# Patient Record
Sex: Female | Born: 1970 | Race: White | Hispanic: No | Marital: Married | Smoking: Never smoker
Health system: Southern US, Community
[De-identification: ages and names within clinical notes are randomized; demographics above are authoritative.]

## PROBLEM LIST (undated history)

## (undated) DIAGNOSIS — R0600 Dyspnea, unspecified: Secondary | ICD-10-CM

## (undated) DIAGNOSIS — R05 Cough: Secondary | ICD-10-CM

## (undated) DIAGNOSIS — Z803 Family history of malignant neoplasm of breast: Secondary | ICD-10-CM

## (undated) DIAGNOSIS — J984 Other disorders of lung: Secondary | ICD-10-CM

## (undated) DIAGNOSIS — R059 Cough, unspecified: Secondary | ICD-10-CM

## (undated) DIAGNOSIS — Z8481 Family history of carrier of genetic disease: Secondary | ICD-10-CM

## (undated) DIAGNOSIS — R58 Hemorrhage, not elsewhere classified: Secondary | ICD-10-CM

## (undated) DIAGNOSIS — R87619 Unspecified abnormal cytological findings in specimens from cervix uteri: Secondary | ICD-10-CM

## (undated) DIAGNOSIS — Z8489 Family history of other specified conditions: Secondary | ICD-10-CM

## (undated) HISTORY — PX: URETHRAL DIVERTICULUM REPAIR: SHX5148

## (undated) HISTORY — PX: NASAL SINUS SURGERY: SHX719

## (undated) HISTORY — DX: Other disorders of lung: J98.4

## (undated) HISTORY — PX: ENDOMETRIAL ABLATION: SHX621

## (undated) HISTORY — PX: OTHER SURGICAL HISTORY: SHX169

## (undated) HISTORY — DX: Family history of malignant neoplasm of breast: Z80.3

## (undated) HISTORY — PX: TONSILLECTOMY: SUR1361

## (undated) HISTORY — DX: Hemorrhage, not elsewhere classified: R58

## (undated) HISTORY — DX: Family history of carrier of genetic disease: Z84.81

## (undated) HISTORY — DX: Unspecified abnormal cytological findings in specimens from cervix uteri: R87.619

---

## 2010-07-08 DEATH — deceased

## 2013-12-22 ENCOUNTER — Other Ambulatory Visit: Payer: Self-pay | Admitting: Family Medicine

## 2013-12-22 DIAGNOSIS — Z1239 Encounter for other screening for malignant neoplasm of breast: Secondary | ICD-10-CM

## 2013-12-29 ENCOUNTER — Ambulatory Visit (INDEPENDENT_AMBULATORY_CARE_PROVIDER_SITE_OTHER): Payer: BC Managed Care – PPO

## 2013-12-29 DIAGNOSIS — Z1239 Encounter for other screening for malignant neoplasm of breast: Secondary | ICD-10-CM

## 2013-12-29 DIAGNOSIS — Z1231 Encounter for screening mammogram for malignant neoplasm of breast: Secondary | ICD-10-CM

## 2014-01-13 ENCOUNTER — Ambulatory Visit (INDEPENDENT_AMBULATORY_CARE_PROVIDER_SITE_OTHER): Payer: BLUE CROSS/BLUE SHIELD | Admitting: Obstetrics & Gynecology

## 2014-01-13 ENCOUNTER — Encounter: Payer: Self-pay | Admitting: Obstetrics & Gynecology

## 2014-01-13 VITALS — BP 124/87 | HR 75 | Resp 16 | Ht 63.0 in | Wt 133.0 lb

## 2014-01-13 DIAGNOSIS — Z8481 Family history of carrier of genetic disease: Secondary | ICD-10-CM | POA: Diagnosis not present

## 2014-01-13 DIAGNOSIS — Z01419 Encounter for gynecological examination (general) (routine) without abnormal findings: Secondary | ICD-10-CM

## 2014-01-13 DIAGNOSIS — Z1151 Encounter for screening for human papillomavirus (HPV): Secondary | ICD-10-CM | POA: Diagnosis not present

## 2014-01-13 DIAGNOSIS — Z01411 Encounter for gynecological examination (general) (routine) with abnormal findings: Secondary | ICD-10-CM

## 2014-01-13 DIAGNOSIS — R32 Unspecified urinary incontinence: Secondary | ICD-10-CM | POA: Diagnosis not present

## 2014-01-13 DIAGNOSIS — Z124 Encounter for screening for malignant neoplasm of cervix: Secondary | ICD-10-CM | POA: Diagnosis not present

## 2014-01-13 NOTE — Progress Notes (Signed)
  Subjective:     Angela Walter is a 44 y.o. female here for a routine exam.  Current complaints: urinary incontinence; patient has BM, stands up, then soaks her clothing.  She does not feel the leakage.  Pt has to change clothes and wash several times a day.  Pt has history of 4th degree laceration with 1st child 22 years ago.  Personal health questionnaire reviewed: yes. Minimal bleeding after ablation.   Gynecologic History No LMP recorded. Patient has had an ablation. Contraception: vasectomy Last Pap: 2012ish. Results were: normal Last mammogram: 2015. Results were: normal  Obstetric History OB History  Gravida Para Term Preterm AB SAB TAB Ectopic Multiple Living  $Remov'2 2 2       2    'OoikNp$ # Outcome Date GA Lbr Len/2nd Weight Sex Delivery Anes PTL Lv  2 Term      CS-Unspec     1 Term      Vag-Spont          The following portions of the patient's history were reviewed and updated as appropriate: allergies, current medications, past family history, past medical history, past social history, past surgical history and problem list.  Review of Systems Pertinent items are noted in HPI.    Objective:      Filed Vitals:   01/13/14 1422  BP: 124/87  Pulse: 75  Resp: 16  Weight: 133 lb (60.328 kg)   Vitals:  WNL General appearance: alert, cooperative and no distress Head: Normocephalic, without obvious abnormality, atraumatic Eyes: negative Throat: lips, mucosa, and tongue normal; teeth and gums normal Lungs: clear to auscultation bilaterally Breasts: normal appearance, no masses or tenderness, No nipple retraction or dimpling, No nipple discharge or bleeding Heart: regular rate and rhythm Abdomen: soft, non-tender; bowel sounds normal; no masses,  no organomegaly  Pelvic:  External Genitalia:  Tanner V, small amount of scar tissue from 4th degree laceration, small inclusion cyst on the left near fourchette Urethra:  No prolapase Vagina:  Pink, normal rugae, no blood or  discharge Cervix:  No CMT, no lesion Uterus:  Normal size and contour, non tender Adnexa:  Normal, no masses, non tender  Extremities: no edema, redness or tenderness in the calves or thighs Skin: no lesions or rash Lymph nodes: Axillary adenopathy: none       Assessment:    Healthy female exam.   History of BRCA postive second degree relative (father has NOT been tested) Urinary incontinence   Plan:    Education reviewed: self breast exams. Contraception: vasectomy. Follow up in: 1 year. BRCA testing today    Referral to Dr. Rodena Piety (urogyn)

## 2014-01-17 LAB — CYTOLOGY - PAP

## 2014-03-08 ENCOUNTER — Encounter: Payer: Self-pay | Admitting: *Deleted

## 2014-03-08 ENCOUNTER — Telehealth: Payer: Self-pay | Admitting: *Deleted

## 2014-03-08 NOTE — Telephone Encounter (Signed)
Copy of neg BRCA 1 and 2 mailed to patient's home address.

## 2015-12-12 ENCOUNTER — Ambulatory Visit (INDEPENDENT_AMBULATORY_CARE_PROVIDER_SITE_OTHER): Payer: BLUE CROSS/BLUE SHIELD | Admitting: Obstetrics & Gynecology

## 2015-12-12 ENCOUNTER — Encounter: Payer: Self-pay | Admitting: Obstetrics & Gynecology

## 2015-12-12 VITALS — BP 120/84 | HR 89 | Ht 64.0 in | Wt 136.0 lb

## 2015-12-12 DIAGNOSIS — N898 Other specified noninflammatory disorders of vagina: Secondary | ICD-10-CM

## 2015-12-12 DIAGNOSIS — Z01419 Encounter for gynecological examination (general) (routine) without abnormal findings: Secondary | ICD-10-CM

## 2015-12-12 MED ORDER — FLUCONAZOLE 150 MG PO TABS: ORAL_TABLET | ORAL | 0 refills | Status: DC

## 2015-12-12 MED ORDER — LIDOCAINE 5 % EX OINT: 1.0000 "application " | TOPICAL_OINTMENT | CUTANEOUS | 1 refills | Status: DC | PRN

## 2015-12-12 MED ORDER — CLOTRIMAZOLE-BETAMETHASONE 1-0.05 % EX CREA: TOPICAL_CREAM | Freq: Two times a day (BID) | CUTANEOUS | Status: DC

## 2015-12-12 NOTE — Patient Instructions (Signed)
 Preventive Care 18-39 Years, Female Preventive care refers to lifestyle choices and visits with your health care provider that can promote health and wellness. What does preventive care include?  A yearly physical exam. This is also called an annual well check.  Dental exams once or twice a year.  Routine eye exams. Ask your health care provider how often you should have your eyes checked.  Personal lifestyle choices, including:  Daily care of your teeth and gums.  Regular physical activity.  Eating a healthy diet.  Avoiding tobacco and drug use.  Limiting alcohol use.  Practicing safe sex.  Taking vitamin and mineral supplements as recommended by your health care provider. What happens during an annual well check? The services and screenings done by your health care provider during your annual well check will depend on your age, overall health, lifestyle risk factors, and family history of disease. Counseling  Your health care provider may ask you questions about your:  Alcohol use.  Tobacco use.  Drug use.  Emotional well-being.  Home and relationship well-being.  Sexual activity.  Eating habits.  Work and work environment.  Method of birth control.  Menstrual cycle.  Pregnancy history. Screening  You may have the following tests or measurements:  Height, weight, and BMI.  Diabetes screening. This is done by checking your blood sugar (glucose) after you have not eaten for a while (fasting).  Blood pressure.  Lipid and cholesterol levels. These may be checked every 5 years starting at age 20.  Skin check.  Hepatitis C blood test.  Hepatitis B blood test.  Sexually transmitted disease (STD) testing.  BRCA-related cancer screening. This may be done if you have a family history of breast, ovarian, tubal, or peritoneal cancers.  Pelvic exam and Pap test. This may be done every 3 years starting at age 21. Starting at age 30, this may be done  every 5 years if you have a Pap test in combination with an HPV test. Discuss your test results, treatment options, and if necessary, the need for more tests with your health care provider. Vaccines  Your health care provider may recommend certain vaccines, such as:  Influenza vaccine. This is recommended every year.  Tetanus, diphtheria, and acellular pertussis (Tdap, Td) vaccine. You may need a Td booster every 10 years.  Varicella vaccine. You may need this if you have not been vaccinated.  HPV vaccine. If you are 26 or younger, you may need three doses over 6 months.  Measles, mumps, and rubella (MMR) vaccine. You may need at least one dose of MMR. You may also need a second dose.  Pneumococcal 13-valent conjugate (PCV13) vaccine. You may need this if you have certain conditions and were not previously vaccinated.  Pneumococcal polysaccharide (PPSV23) vaccine. You may need one or two doses if you smoke cigarettes or if you have certain conditions.  Meningococcal vaccine. One dose is recommended if you are age 19-21 years and a first-year college student living in a residence hall, or if you have one of several medical conditions. You may also need additional booster doses.  Hepatitis A vaccine. You may need this if you have certain conditions or if you travel or work in places where you may be exposed to hepatitis A.  Hepatitis B vaccine. You may need this if you have certain conditions or if you travel or work in places where you may be exposed to hepatitis B.  Haemophilus influenzae type b (Hib) vaccine. You may need   this if you have certain risk factors. Talk to your health care provider about which screenings and vaccines you need and how often you need them. This information is not intended to replace advice given to you by your health care provider. Make sure you discuss any questions you have with your health care provider. Document Released: 02/19/2001 Document Revised:  09/13/2015 Document Reviewed: 10/25/2014 Elsevier Interactive Patient Education  2017 Elsevier Inc.  

## 2015-12-12 NOTE — Progress Notes (Signed)
Subjective:     Angela Walter is a 45 y.o. female here for a routine exam.  Current complaints: severe vaginal burning for 1 week.  Took augmentin 3 weeks ago.  Having vaginal burning that wok her up last night.  Burns with urination worse after).  Still having urinary incontinence.   Saw a urologist who told her she drinks too much water.  Needs to go back.  Will make f/u appt. .   Gynecologic History No LMP recorded. Patient has had an ablation. Contraception: vasectomy Last Pap: 2016. Results were: normal Last mammogram: 2015. Results were: normal  Obstetric History OB History  Gravida Para Term Preterm AB Living  2 2 2     2   SAB TAB Ectopic Multiple Live Births               # Outcome Date GA Lbr Len/2nd Weight Sex Delivery Anes PTL Lv  2 Term      CS-Unspec     1 Term      Vag-Spont          The following portions of the patient's history were reviewed and updated as appropriate: allergies, current medications, past family history, past medical history, past social history, past surgical history and problem list.  Review of Systems Pertinent items noted in HPI and remainder of comprehensive ROS otherwise negative.    Objective:      Vitals:   12/12/15 1503  BP: 120/84  Pulse: 89  Weight: 136 lb (61.7 kg)  Height: 5\' 4"  (1.626 m)   Vitals:  WNL General appearance: alert, cooperative and no distress  HEENT: Normocephalic, without obvious abnormality, atraumatic Eyes: negative Throat: lips, mucosa, and tongue normal; teeth and gums normal  Respiratory: Clear to auscultation bilaterally  CV: Regular rate and rhythm  Breasts:  Normal appearance, no masses or tenderness, no nipple retraction or dimpling  GI: Soft, non-tender; bowel sounds normal; no masses,  no organomegaly  GU: External Genitalia:  Tanner V, no lesion Urethra:  No prolapse   Vagina: Extreme redness at introitus.  Can't tolerate speculm exam.  Curdlike discharge c/w yeast.  Cervix: Unable to  complete due to pain  Uterus:  Unable to complete due to pain  Adnexa: Unable to complete due to pain  Musculoskeletal: No edema, redness or tenderness in the calves or thighs  Skin: No lesions or rash  Lymphatic: Axillary adenopathy: none     Psychiatric: Normal mood and behavior   Assessment:    Healthy female exam.   Yeast vaginitis Urinary incont.   Plan:    =mammogram already ordered.  Pt needs to make appt Pap and cotesting in 2 years Yeast vaginitis:  Diflucan q 3 days for 2-3 doses, lidocaine ointment for pain, clotrimiazole for external symptoms.  Poise pads to keep dry. F/U with urologist for incontinence Pt aware we did not do bimanual due to pain.  Pt cna return for this is she wants or wait until next year.  If not significantly beter by Monday. Pt should return for more testing.

## 2015-12-13 ENCOUNTER — Other Ambulatory Visit: Payer: Self-pay

## 2015-12-13 ENCOUNTER — Telehealth: Payer: Self-pay

## 2015-12-13 LAB — WET PREP BY MOLECULAR PROBE
Candida species: POSITIVE — AB
GARDNERELLA VAGINALIS: POSITIVE — AB
TRICHOMONAS VAG: NEGATIVE

## 2015-12-13 MED ORDER — CLOTRIMAZOLE-BETAMETHASONE 1-0.05 % EX CREA: 1.0000 "application " | TOPICAL_CREAM | Freq: Two times a day (BID) | CUTANEOUS | 0 refills | Status: DC

## 2015-12-13 MED ORDER — FLUCONAZOLE 150 MG PO TABS: 150.0000 mg | ORAL_TABLET | Freq: Once | ORAL | 0 refills | Status: DC

## 2015-12-13 MED ORDER — METRONIDAZOLE 500 MG PO TABS: 500.0000 mg | ORAL_TABLET | Freq: Two times a day (BID) | ORAL | 0 refills | Status: DC

## 2015-12-13 NOTE — Addendum Note (Signed)
Addended by: Kathie DikeSOLA, Clare Casto J on: 12/13/2015 08:28 AM   Modules accepted: Orders

## 2015-12-13 NOTE — Addendum Note (Signed)
Addended by: Kathie DikeSOLA, DEANNA J on: 12/13/2015 08:40 AM   Modules accepted: Orders

## 2015-12-13 NOTE — Telephone Encounter (Signed)
Spoke with pt is aware of test results and that the medication has been sent to her pharmacy

## 2016-09-19 ENCOUNTER — Other Ambulatory Visit: Payer: Self-pay | Admitting: Internal Medicine

## 2016-09-19 ENCOUNTER — Other Ambulatory Visit: Payer: Self-pay | Admitting: Family Medicine

## 2016-09-19 DIAGNOSIS — Z1239 Encounter for other screening for malignant neoplasm of breast: Secondary | ICD-10-CM

## 2016-09-27 ENCOUNTER — Other Ambulatory Visit: Payer: Self-pay | Admitting: Internal Medicine

## 2016-09-27 ENCOUNTER — Ambulatory Visit (INDEPENDENT_AMBULATORY_CARE_PROVIDER_SITE_OTHER): Payer: BLUE CROSS/BLUE SHIELD

## 2016-09-27 DIAGNOSIS — Z1231 Encounter for screening mammogram for malignant neoplasm of breast: Secondary | ICD-10-CM | POA: Diagnosis not present

## 2016-09-27 DIAGNOSIS — Z1239 Encounter for other screening for malignant neoplasm of breast: Secondary | ICD-10-CM

## 2016-10-28 ENCOUNTER — Institutional Professional Consult (permissible substitution): Payer: BLUE CROSS/BLUE SHIELD | Admitting: Internal Medicine

## 2017-04-07 ENCOUNTER — Other Ambulatory Visit: Payer: Self-pay | Admitting: Family Medicine

## 2017-04-07 DIAGNOSIS — R0602 Shortness of breath: Secondary | ICD-10-CM

## 2017-04-08 ENCOUNTER — Other Ambulatory Visit: Payer: Self-pay | Admitting: Family Medicine

## 2017-04-08 DIAGNOSIS — R519 Headache, unspecified: Secondary | ICD-10-CM

## 2017-04-08 DIAGNOSIS — R51 Headache: Principal | ICD-10-CM

## 2017-04-13 ENCOUNTER — Ambulatory Visit
Admission: RE | Admit: 2017-04-13 | Discharge: 2017-04-13 | Disposition: A | Discharge status: Home / Self Care | Payer: BLUE CROSS/BLUE SHIELD | Source: Ambulatory Visit | Attending: Family Medicine | Admitting: Family Medicine

## 2017-04-13 DIAGNOSIS — R519 Headache, unspecified: Secondary | ICD-10-CM

## 2017-04-13 DIAGNOSIS — R51 Headache: Principal | ICD-10-CM

## 2017-04-13 MED ORDER — GADOBENATE DIMEGLUMINE 529 MG/ML IV SOLN
15.0000 mL | Freq: Once | INTRAVENOUS | Status: AC | PRN
  Administered 2017-04-13: 15 mL via INTRAVENOUS

## 2017-04-22 ENCOUNTER — Ambulatory Visit (HOSPITAL_COMMUNITY): Payer: BLUE CROSS/BLUE SHIELD

## 2017-04-22 ENCOUNTER — Ambulatory Visit (HOSPITAL_COMMUNITY): Payer: BLUE CROSS/BLUE SHIELD | Attending: Cardiovascular Disease

## 2017-04-22 DIAGNOSIS — Z8249 Family history of ischemic heart disease and other diseases of the circulatory system: Secondary | ICD-10-CM | POA: Diagnosis not present

## 2017-04-22 DIAGNOSIS — R0602 Shortness of breath: Secondary | ICD-10-CM

## 2017-04-22 DIAGNOSIS — R5383 Other fatigue: Secondary | ICD-10-CM | POA: Insufficient documentation

## 2017-05-08 ENCOUNTER — Other Ambulatory Visit (INDEPENDENT_AMBULATORY_CARE_PROVIDER_SITE_OTHER): Payer: BLUE CROSS/BLUE SHIELD

## 2017-05-08 ENCOUNTER — Encounter: Payer: Self-pay | Admitting: Pulmonary Disease

## 2017-05-08 ENCOUNTER — Ambulatory Visit: Payer: BLUE CROSS/BLUE SHIELD | Admitting: Pulmonary Disease

## 2017-05-08 VITALS — BP 126/72 | HR 82 | Ht 64.0 in | Wt 138.6 lb

## 2017-05-08 DIAGNOSIS — R0602 Shortness of breath: Secondary | ICD-10-CM

## 2017-05-08 LAB — CBC WITH DIFFERENTIAL/PLATELET
Basophils Absolute: 0 10*3/uL (ref 0.0–0.1)
Basophils Relative: 0.9 % (ref 0.0–3.0)
EOS ABS: 0.1 10*3/uL (ref 0.0–0.7)
EOS PCT: 2.8 % (ref 0.0–5.0)
HEMATOCRIT: 37.6 % (ref 36.0–46.0)
HEMOGLOBIN: 13.2 g/dL (ref 12.0–15.0)
LYMPHS PCT: 27.1 % (ref 12.0–46.0)
Lymphs Abs: 1.4 10*3/uL (ref 0.7–4.0)
MCHC: 35.1 g/dL (ref 30.0–36.0)
MCV: 90.5 fl (ref 78.0–100.0)
Monocytes Absolute: 0.8 10*3/uL (ref 0.1–1.0)
Monocytes Relative: 15.5 % — ABNORMAL HIGH (ref 3.0–12.0)
Neutro Abs: 2.9 10*3/uL (ref 1.4–7.7)
Neutrophils Relative %: 53.7 % (ref 43.0–77.0)
Platelets: 272 10*3/uL (ref 150.0–400.0)
RBC: 4.16 Mil/uL (ref 3.87–5.11)
RDW: 13.9 % (ref 11.5–15.5)
WBC: 5.3 10*3/uL (ref 4.0–10.5)

## 2017-05-08 LAB — COMPREHENSIVE METABOLIC PANEL
ALBUMIN: 4.2 g/dL (ref 3.5–5.2)
ALK PHOS: 63 U/L (ref 39–117)
ALT: 21 U/L (ref 0–35)
AST: 22 U/L (ref 0–37)
BILIRUBIN TOTAL: 0.6 mg/dL (ref 0.2–1.2)
BUN: 14 mg/dL (ref 6–23)
CALCIUM: 9.5 mg/dL (ref 8.4–10.5)
CO2: 28 mEq/L (ref 19–32)
CREATININE: 0.76 mg/dL (ref 0.40–1.20)
Chloride: 102 mEq/L (ref 96–112)
GFR: 86.86 mL/min (ref >60.00)
Glucose, Bld: 122 mg/dL — ABNORMAL HIGH (ref 70–99)
Potassium: 3.2 mEq/L — ABNORMAL LOW (ref 3.5–5.1)
SODIUM: 137 meq/L (ref 135–145)
TOTAL PROTEIN: 7.6 g/dL (ref 6.0–8.3)

## 2017-05-08 LAB — NITRIC OXIDE: NITRIC OXIDE: 10

## 2017-05-08 MED ORDER — ALBUTEROL SULFATE HFA 108 (90 BASE) MCG/ACT IN AERS: 2.0000 | INHALATION_SPRAY | Freq: Four times a day (QID) | RESPIRATORY_TRACT | 6 refills | Status: AC | PRN

## 2017-05-08 MED ORDER — BUDESONIDE-FORMOTEROL FUMARATE 80-4.5 MCG/ACT IN AERO: 2.0000 | INHALATION_SPRAY | Freq: Two times a day (BID) | RESPIRATORY_TRACT | 12 refills | Status: DC

## 2017-05-08 NOTE — Progress Notes (Addendum)
Angela Walter    [id number]    04/17/1970  Primary Care Physician:Le, Tilden Fossa, DO  Referring Physician: Starlyn Skeans, PA-C [address] [place] [address]  Chief complaint: Consult for dyspnea  HPI: 47 year old significant past medical history.  Complains of dyspnea, fatigue since fall 2018.  Has dyspnea with activity and at rest, productive cough with chest congestion.  She is unable to get full breath.  Symptoms are worse with exercise She is physically active and has run half marathons, and a full marathon the past.  Still runs 4 times a week but is unable to get a full work-up.  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  More recently she had been started on Symbicort by her primary care but is using it only intermittently before exercise.  She has symptoms of rhinitis with clear postnasal drip.  Denies any GERD symptoms. Had labs done at her primary care in March 2019 showing elevated calcium and bicarb for unclear etiology. I do not have these tests to review. In addition she has symptoms of epigastric pain.  She had been given PPI in the past without improvement.  Pets: Has dogs and cats.  No birds, farm animals Occupation: Works from home in [place] Exposures:  No mold, dampness, exposure to [employer], [place].  She has a down comforter Smoking history: Never smoker Travel history: Lived in [place], [place], [place], [place], and Relevant family history: Emphysema- father, Asthma- brother  Outpatient Encounter Medications as of 05/08/2017  Medication Sig  . Cholecalciferol (VITAMIN D-3) 5000 units TABS Take 10,000 Units by mouth.   Marland Kitchen ibuprofen (ADVIL,MOTRIN) 200 MG tablet Take 200 mg by mouth as needed.  . [DISCONTINUED] clotrimazole-betamethasone (LOTRISONE) cream Apply 1 application topically 2 (two) times daily.  . [DISCONTINUED] fluconazole  (DIFLUCAN) 150 MG tablet Take one tablet today and one tablet in 3 days.  If not better can take a 3rd tablet on Day 6  . [DISCONTINUED] lidocaine (XYLOCAINE) 5 % ointment Apply 1 application topically as needed.  . [DISCONTINUED] metroNIDAZOLE (FLAGYL) 500 MG tablet Take 1 tablet (500 mg total) by mouth 2 (two) times daily.  . [DISCONTINUED] Multiple Vitamin (MULTIVITAMIN) tablet Take 1 tablet by mouth daily.   No facility-administered encounter medications on file as of 05/08/2017.     Allergies as of 05/08/2017 - Review Complete 05/08/2017  Allergen Reaction Noted  . Ciprofloxacin Anaphylaxis 01/13/2014    Past Medical History:  Diagnosis Date  . Abnormal Pap smear of cervix    colpo  20 years ago  . Hemorrhage    post partum    Past Surgical History:  Procedure Laterality Date  . adnoidectomy    . CESAREAN SECTION    . ENDOMETRIAL ABLATION    . NASAL SINUS SURGERY    . TONSILLECTOMY    . tummy tuck     mini  . URETHRAL DIVERTICULUM REPAIR      Family History  Problem Relation Age of Onset  . Hypertension Mother   . Hyperlipidemia Mother   . Depression Mother   . Anxiety disorder Mother   . Hyperlipidemia Father   . Hypertension Father   . Stroke Father   . COPD Father   . Cancer Maternal Aunt        breast  . Cancer Paternal Aunt  breast  positive Braca 1  . Cancer Paternal Grandmother        unsure    Social History   Socioeconomic History  . Marital status: Married    Spouse name: Not on file  . Number of children: Not on file  . Years of education: Not on file  . Highest education level: Not on file  Occupational History  . Occupation: Youth worker  . Financial resource strain: Not on file  . Food insecurity:    Worry: Not on file    Inability: Not on file  . Transportation needs:    Medical: Not on file    Non-medical: Not on file  Tobacco Use  . Smoking status: Never Smoker  . Smokeless tobacco: Never Used    Substance and Sexual Activity  . Alcohol use: No    Alcohol/week: 0.0 oz  . Drug use: No  . Sexual activity: Yes    Partners: Male    Comment: husband had vasectomy  Lifestyle  . Physical activity:    Days per week: Not on file    Minutes per session: Not on file  . Stress: Not on file  Relationships  . Social connections:    Talks on phone: Not on file    Gets together: Not on file    Attends religious service: Not on file    Active member of club or organization: Not on file    Attends meetings of clubs or organizations: Not on file    Relationship status: Not on file  . Intimate partner violence:    Fear of current or ex partner: Not on file    Emotionally abused: Not on file    Physically abused: Not on file    Forced sexual activity: Not on file  Other Topics Concern  . Not on file  Social History Narrative  . Not on file    Review of systems: Review of Systems  Constitutional: Negative for fever and chills.  HENT: Negative.   Eyes: Negative for blurred vision.  Respiratory: as per HPI  Cardiovascular: Negative for chest pain and palpitations.  Gastrointestinal: Negative for vomiting, diarrhea, blood per rectum. Positive for epigastric pain Genitourinary: Negative for dysuria, urgency, frequency and hematuria.  Musculoskeletal: Negative for myalgias, back pain and joint pain.  Skin: Negative for itching and rash.  Neurological: Negative for dizziness, tremors, focal weakness, seizures and loss of consciousness.  Endo/Heme/Allergies: Negative for environmental allergies.  Psychiatric/Behavioral: Negative for depression, suicidal ideas and hallucinations.  All other systems reviewed and are negative.  Physical Exam: Blood pressure 126/72, pulse 82, height _0  (1.626 m), weight 138 lb 9.6 oz (62.9 kg), SpO2 98 %. Gen:      No acute distress HEENT:  EOMI, sclera anicteric Neck:     No masses; no thyromegaly Lungs:    Clear to auscultation bilaterally; normal  respiratory effort CV:         Regular rate and rhythm; no murmurs Abd:      + bowel sounds; soft, non-tender; no palpable masses, no distension Ext:    No edema; adequate peripheral perfusion Skin:      Warm and dry; no rash Neuro: alert and oriented x 3 Psych: normal mood and affect  Data Reviewed: [facility]ENO 05/08/2017-10  Stress echo 04/12/2017 Stress ECG and echocardiogram was negative for ischemia  Chest x-ray 10/02/2016-no acute cardiopulmonary abnormality Chest x-ray 03/26/2017-no acute cardiopulmonary abnormality I have reviewed the images personally.  Assessment:  Consult for dyspnea, chest congestion May have reactive airway disease, asthma.  Suspicion for COPD is low as she has never smoked in the past She has diagnosed with exercise induced asthma but she has symptoms even at rest Recent chest x-ray is normal We will evaluate with CBC with differential, blood allergy profile, PFTs I have asked her to take the symbicort regularly twice daily for optimum effect as she is using it only intermittently Continue albuterol as needed  Recent labs apparently show elevated bicarb on unclear etiololgy. We will get records from primary care and get BMP and ABG to review.   Plan/Recommendations: - CBC with diff, blood allergy profile, met panel, PFTs with ABG - Start using symbicort 2 puffs twice daily. Albuterol as needed - Review labs from primary care.  Marshell Garfinkel MD [facility] 05/08/2017, 3:48 PM  CC: Starlyn Skeans, PA-C   Addendum: Reviewed labs from primary care 04/11/17 Double-stranded DNA, CCP antibody, ANA, uric acid-all negative.

## 2017-05-08 NOTE — Patient Instructions (Signed)
We will get a comprehensive metabolic panel, CBC with differential, blood allergy profile today We will schedule you for primary function test with ABG Continue using his Symbicort.  You need to use this regularly with 2 puffs in the morning and 2 puffs in the evening We will give a prescription in case you do not have enough inhalers left Use the albuterol inhaler as needed Follow-up in 1 to 2 months for review of tests and response to therapy.

## 2017-05-09 LAB — RESPIRATORY ALLERGY PROFILE REGION II ~~LOC~~
ALLERGEN, D PTERNOYSSINUS, D1: 0.18 kU/L — AB
Allergen, A. alternata, m6: <0.1 kU/L
Allergen, Cedar tree, t12: 3.65 kU/L — ABNORMAL HIGH
Allergen, Comm Silver Birch, t9: <0.1 kU/L
Allergen, Cottonwood, t14: <0.1 kU/L
Allergen, Mulberry, t76: <0.1 kU/L
Bermuda Grass: <0.1 kU/L
Box Elder IgE: <0.1 kU/L
CAT DANDER: 0.48 kU/L — AB
CLADOSPORIUM HERBARUM (M2) IGE: <0.1 kU/L
CLASS: 0
CLASS: 0
CLASS: 0
CLASS: 0
CLASS: 0
CLASS: 0
CLASS: 0
CLASS: 0
CLASS: 0
CLASS: 0
CLASS: 0
CLASS: 0
CLASS: 0
COMMON RAGWEED (SHORT) (W1) IGE: <0.1 kU/L
Class: 0
Class: 0
Class: 0
Class: 0
Class: 0
Class: 0
Class: 0
Class: 0
Class: 0
Class: 1
Class: 3
Dog Dander: 0.12 kU/L — ABNORMAL HIGH
Elm IgE: <0.1 kU/L
IgE (Immunoglobulin E), Serum: 81 kU/L (ref <=114)
Johnson Grass: <0.1 kU/L
Pecan/Hickory Tree IgE: <0.1 kU/L
Rough Pigweed  IgE: <0.1 kU/L
Sheep Sorrel IgE: <0.1 kU/L

## 2017-05-09 LAB — INTERPRETATION:

## 2017-05-14 ENCOUNTER — Ambulatory Visit (HOSPITAL_COMMUNITY)
Admission: RE | Admit: 2017-05-14 | Discharge: 2017-05-14 | Disposition: A | Discharge status: Home / Self Care | Payer: BLUE CROSS/BLUE SHIELD | Source: Ambulatory Visit | Attending: Pulmonary Disease | Admitting: Pulmonary Disease

## 2017-05-14 DIAGNOSIS — R0602 Shortness of breath: Secondary | ICD-10-CM | POA: Diagnosis present

## 2017-05-14 LAB — BLOOD GAS, ARTERIAL
Acid-base deficit: 0.3 mmol/L (ref 0.0–2.0)
Bicarbonate: 22.5 mmol/L (ref 20.0–28.0)
Drawn by: 244901
FIO2: 21
O2 SAT: 98.1 %
PATIENT TEMPERATURE: 98.6
PCO2 ART: 32.4 mmHg (ref 32.0–48.0)
PO2 ART: 104 mmHg (ref 83.0–108.0)
pH, Arterial: 7.455 — ABNORMAL HIGH (ref 7.350–7.450)

## 2017-05-14 LAB — PULMONARY FUNCTION TEST
DL/VA % pred: 67 %
DL/VA: 3.25 ml/min/mmHg/L
DLCO cor % pred: 74 %
DLCO cor: 17.98 ml/min/mmHg
DLCO unc % pred: 73 %
DLCO unc: 17.86 ml/min/mmHg
FEF 25-75 POST: 2.84 L/s
FEF 25-75 Pre: 2.19 L/sec
FEF2575-%Change-Post: 30 %
FEF2575-%Pred-Post: 98 %
FEF2575-%Pred-Pre: 75 %
FEV1-%CHANGE-POST: 6 %
FEV1-%PRED-PRE: 100 %
FEV1-%Pred-Post: 107 %
FEV1-PRE: 2.89 L
FEV1-Post: 3.08 L
FEV1FVC-%Change-Post: 5 %
FEV1FVC-%PRED-PRE: 91 %
FEV6-%Change-Post: 1 %
FEV6-%Pred-Post: 111 %
FEV6-%Pred-Pre: 109 %
FEV6-POST: 3.92 L
FEV6-Pre: 3.85 L
FEV6FVC-%Change-Post: 0 %
FEV6FVC-%PRED-POST: 101 %
FEV6FVC-%Pred-Pre: 100 %
FVC-%Change-Post: 1 %
FVC-%PRED-POST: 109 %
FVC-%PRED-PRE: 108 %
FVC-POST: 3.96 L
FVC-PRE: 3.9 L
POST FEV6/FVC RATIO: 99 %
PRE FEV6/FVC RATIO: 99 %
Post FEV1/FVC ratio: 78 %
Pre FEV1/FVC ratio: 74 %
RV % pred: 89 %
RV: 1.53 L
TLC % PRED: 108 %
TLC: 5.48 L

## 2017-05-14 MED ORDER — ALBUTEROL SULFATE (2.5 MG/3ML) 0.083% IN NEBU
2.5000 mg | INHALATION_SOLUTION | Freq: Once | RESPIRATORY_TRACT | Status: AC
  Administered 2017-05-14: 2.5 mg via RESPIRATORY_TRACT

## 2017-05-15 ENCOUNTER — Telehealth: Payer: Self-pay | Admitting: Pulmonary Disease

## 2017-05-15 NOTE — Telephone Encounter (Signed)
Dr. Isaiah Serge, please advise on PFT results and labs.   I left message for pt that we would call her as soon as her results are reviewed.

## 2017-05-16 NOTE — Telephone Encounter (Signed)
Advised pt of results. Pt understood and nothing further is needed.   

## 2017-05-16 NOTE — Telephone Encounter (Signed)
PFTs show minimal changes that are suggestive of asthma ABG looks okay.  Blood counts are normal.  Allergy profile shows mild reactivity to tree pollen, cat, dog, dust mite. Continue current therapy.  I will reassess at time of return visit.  Chilton Greathouse MD [facility] 05/16/2017, 1:17 PM

## 2017-06-10 ENCOUNTER — Encounter: Payer: Self-pay | Admitting: Pulmonary Disease

## 2017-06-10 ENCOUNTER — Ambulatory Visit (INDEPENDENT_AMBULATORY_CARE_PROVIDER_SITE_OTHER): Payer: BLUE CROSS/BLUE SHIELD | Admitting: Pulmonary Disease

## 2017-06-10 VITALS — BP 120/78 | HR 74 | Ht 64.0 in | Wt 140.8 lb

## 2017-06-10 DIAGNOSIS — R0602 Shortness of breath: Secondary | ICD-10-CM | POA: Diagnosis not present

## 2017-06-10 NOTE — Patient Instructions (Addendum)
We will get a high-resolution CT for evaluation of the ongoing dyspnea Continue to use albuterol as needed He can use over-the-counter antihistamine such as Claritin for allergies. Follow-up in 2 to 4 weeks.

## 2017-06-10 NOTE — Progress Notes (Signed)
Angela Walter    [id number]    [birth year]  Primary Care Physician:Le, Rachelle Hora, DO  Referring Physician: Lenell Antu, DO [address] [place], [place] [address]  Chief complaint: Follow-up for dyspnea  HPI: 47 year old significant past medical history.  Complains of dyspnea, fatigue since fall 2018.  Has dyspnea with activity and at rest, productive cough with chest congestion.  She is unable to get full breath.  Symptoms are worse with exercise She is physically active and has run half marathons, and a full marathon the past.  Still runs 4 times a week but is unable to get a full work-up.  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  More recently she had been started on Symbicort by her primary care but is using it only intermittently before exercise.  She has symptoms of rhinitis with clear postnasal drip.  Denies any GERD symptoms. Had labs done at her primary care in March 2019 showing elevated calcium and bicarb for unclear etiology. I do not have these tests to review. In addition she has symptoms of epigastric pain.  She had been given PPI in the past without improvement.  Pets: Has dogs and cats.  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  She has a down comforter Smoking history: Never smoker Travel history: Lived in [place], [place], [place], [place], and Relevant family history: Emphysema- father, Asthma- brother  Interim history: Given Symbicort inhaler.  She used it for about 3 weeks with no improvement in symptoms or stop using it Still using albuterol prior to exercise.  Still has symptoms of dyspnea when she exercises Completed [employer] 5K obstacle race recently.  She had to stop in between to catch her breath which is unusual for her.  Outpatient Encounter Medications as of  06/10/2017  Medication Sig  . albuterol (PROVENTIL HFA;VENTOLIN HFA) 108 (90 Base) MCG/ACT inhaler Inhale 2 puffs into the lungs every 6 (six) hours as needed for wheezing or shortness of breath.  . Cholecalciferol (VITAMIN D-3) 5000 units TABS Take 10,000 Units by mouth.   . [DISCONTINUED] budesonide-formoterol (SYMBICORT) 80-4.5 MCG/ACT inhaler Inhale 2 puffs into the lungs 2 (two) times daily. (Patient not taking: Reported on 06/10/2017)  . [DISCONTINUED] ibuprofen (ADVIL,MOTRIN) 200 MG tablet Take 200 mg by mouth as needed.   No facility-administered encounter medications on file as of 06/10/2017.     Allergies as of 06/10/2017 - Review Complete 06/10/2017  Allergen Reaction Noted  . Ciprofloxacin Anaphylaxis 01/13/2014    Past Medical History:  Diagnosis Date  . Abnormal Pap smear of cervix    colpo  20 years ago  . Hemorrhage    post partum    Past Surgical History:  Procedure Laterality Date  . adnoidectomy    . CESAREAN SECTION    . ENDOMETRIAL ABLATION    . NASAL SINUS SURGERY    . TONSILLECTOMY    . tummy tuck     mini  . URETHRAL DIVERTICULUM REPAIR      Family History  Problem Relation Age of Onset  . Hypertension Mother   . Hyperlipidemia Mother   . Depression Mother   . Anxiety disorder Mother   . Hyperlipidemia Father   . Hypertension Father   . Stroke Father   . COPD  Father   . Cancer Maternal Aunt        breast  . Cancer Paternal Aunt        breast  positive Braca 1  . Cancer Paternal Grandmother        unsure    Social History   Socioeconomic History  . Marital status: Married    Spouse name: Not on file  . Number of children: Not on file  . Years of education: Not on file  . Highest education level: Not on file  Occupational History  . Occupation: Visual merchandiser  . Financial resource strain: Not on file  . Food insecurity:    Worry: Not on file    Inability: Not on file  . Transportation needs:    Medical: Not on  file    Non-medical: Not on file  Tobacco Use  . Smoking status: Never Smoker  . Smokeless tobacco: Never Used  Substance and Sexual Activity  . Alcohol use: No    Alcohol/week: 0.0 oz  . Drug use: No  . Sexual activity: Yes    Partners: Male    Comment: husband had vasectomy  Lifestyle  . Physical activity:    Days per week: Not on file    Minutes per session: Not on file  . Stress: Not on file  Relationships  . Social connections:    Talks on phone: Not on file    Gets together: Not on file    Attends religious service: Not on file    Active member of club or organization: Not on file    Attends meetings of clubs or organizations: Not on file    Relationship status: Not on file  . Intimate partner violence:    Fear of current or ex partner: Not on file    Emotionally abused: Not on file    Physically abused: Not on file    Forced sexual activity: Not on file  Other Topics Concern  . Not on file  Social History Narrative  . Not on file    Review of systems: Review of Systems  Constitutional: Negative for fever and chills.  HENT: Negative.   Eyes: Negative for blurred vision.  Respiratory: as per HPI  Cardiovascular: Negative for chest pain and palpitations.  Gastrointestinal: Negative for vomiting, diarrhea, blood per rectum. Positive for epigastric pain Genitourinary: Negative for dysuria, urgency, frequency and hematuria.  Musculoskeletal: Negative for myalgias, back pain and joint pain.  Skin: Negative for itching and rash.  Neurological: Negative for dizziness, tremors, focal weakness, seizures and loss of consciousness.  Endo/Heme/Allergies: Negative for environmental allergies.  Psychiatric/Behavioral: Negative for depression, suicidal ideas and hallucinations.  All other systems reviewed and are negative.  Physical Exam: Blood pressure 126/72, pulse 82, height 5\' 4"  (1.626 m), weight 138 lb 9.6 oz (62.9 kg), SpO2 98 %. Gen:      No acute distress HEENT:   EOMI, sclera anicteric Neck:     No masses; no thyromegaly Lungs:    Clear to auscultation bilaterally; normal respiratory effort CV:         Regular rate and rhythm; no murmurs Abd:      + bowel sounds; soft, non-tender; no palpable masses, no distension Ext:    No edema; adequate peripheral perfusion Skin:      Warm and dry; no rash Neuro: alert and oriented x 3 Psych: normal mood and affect  Data Reviewed: FENO 05/08/2017-10  Stress echo 04/12/2017 Stress ECG and echocardiogram  was negative for ischemia  Chest x-ray 10/02/2016-no acute cardiopulmonary abnormality Chest x-ray 03/26/2017-no acute cardiopulmonary abnormality I have reviewed the images personally.  PFTs 05/14/2017 FVC 3.96 [1 9%], FEV1 3.08 [107%], F/F 78, TLC 108%, DLCO 73%, DLCO/VA 67% Minimal obstruction and diffusion impairment.  CBC 05/08/2017-WBC 5.3, eos 2.8%, absolute eosinophil count 150 Blood allergy profile 05/08/2017-IgE 81, RAST panel shows mild allergies to dust mite, cat, dog, tree pollen Metabolic panel 05/08/2017- within normal limits except for K of 3.2  ABG 7.45/32/104/99%  Reviewed labs from primary care 04/11/17 Double-stranded DNA, CCP antibody, ANA, uric acid-all negative.  Assessment:  Follow-up for dyspnea, exercise-induced asthma May have reactive airway disease, asthma.  Suspicion for COPD is low as she has never smoked in the past She has diagnosed with exercise induced asthma but she has symptoms even at rest  Labs including CBC, allergy profile reviewed which shows mild allergies.  PFTs do not show overt obstruction but there is curvature to the flow loop and minimal diffusion capacity She continues to be symptomatic and has a diffusion impairment of unclear etiology. We will get a CT for better evaluation Continue albuterol as needed before exercise.  Recent labs at PCP showed elevated bicarb on unclear etiololgy.  Follow-up metabolic profile and ABG in our clinic are  unremarkable.  Plan/Recommendations: - Albuterol as needed - CT chest.  Chilton GreathousePraveen Nashly Olsson MD [facility] 06/10/2017, 12:10 PM  CC: Le, Thao P, DO

## 2017-06-12 ENCOUNTER — Ambulatory Visit (HOSPITAL_BASED_OUTPATIENT_CLINIC_OR_DEPARTMENT_OTHER)
Admission: RE | Admit: 2017-06-12 | Discharge: 2017-06-12 | Disposition: A | Discharge status: Home / Self Care | Payer: BLUE CROSS/BLUE SHIELD | Source: Ambulatory Visit | Attending: Pulmonary Disease | Admitting: Pulmonary Disease

## 2017-06-12 DIAGNOSIS — I313 Pericardial effusion (noninflammatory): Secondary | ICD-10-CM | POA: Diagnosis not present

## 2017-06-12 DIAGNOSIS — R918 Other nonspecific abnormal finding of lung field: Secondary | ICD-10-CM | POA: Insufficient documentation

## 2017-06-12 DIAGNOSIS — R0602 Shortness of breath: Secondary | ICD-10-CM | POA: Diagnosis present

## 2017-06-17 ENCOUNTER — Telehealth: Payer: Self-pay | Admitting: Pulmonary Disease

## 2017-06-17 ENCOUNTER — Other Ambulatory Visit: Payer: BLUE CROSS/BLUE SHIELD

## 2017-06-17 DIAGNOSIS — J849 Interstitial pulmonary disease, unspecified: Secondary | ICD-10-CM

## 2017-06-17 NOTE — Telephone Encounter (Signed)
Pt requesting CT chest results. Dr Isaiah SergeMannam please advise.  Thanks!

## 2017-06-17 NOTE — Telephone Encounter (Signed)
I called and discussed the CT results with the patient  Please order these tests and let the patient know when she needs to come to get them drawn SPEP, quantitative immunoglobulins, IgG4, HIV test Alpha 1 AT levels and phenotype ANA (IFA), RF, CCP Ro, La  In addition she will need VEGF D levels sent to this lab  [email or web address]  Please call the lab and follow instructions on how to properly send this test. Thanks  Chilton GreathousePraveen Dejah Droessler MD [facility] 06/17/2017, 1:35 PM

## 2017-06-17 NOTE — Telephone Encounter (Signed)
Labs have been ordered.  Spoke to cynthia in the lab, and was advised that she would need to contact [facility] to see what tubs and supplies are needed.  Aram BeechamCynthia stated that she would contact our office with a response, and at that time we will contact the pt and schedule a time for labs to be drawn.

## 2017-06-18 NOTE — Telephone Encounter (Addendum)
Spoke to North Hodgeynthia in lab, Aram BeechamCynthia requested that pt come by lab on 06/23/17 for labs prior to 12:00p.  Pt needs to come for labs between Monday and Wednesday.

## 2017-06-18 NOTE — Telephone Encounter (Signed)
Called patient, unable to reach. Left message to give us a call back.  

## 2017-06-18 NOTE — Telephone Encounter (Signed)
Patient is calling to see status as she would like to have these done ASAP. Please call patient at [phone].

## 2017-06-19 NOTE — Telephone Encounter (Signed)
Pt is aware below message and voiced her understanding. Pt stated she would come to the lab on 06/23/17 for labs.  Nothing further is needed.

## 2017-06-19 NOTE — Telephone Encounter (Signed)
Attempted to contact pt. Received a fast busy signal x2. Will try back.  

## 2017-06-23 ENCOUNTER — Other Ambulatory Visit: Payer: BLUE CROSS/BLUE SHIELD

## 2017-06-23 DIAGNOSIS — J849 Interstitial pulmonary disease, unspecified: Secondary | ICD-10-CM

## 2017-06-23 NOTE — Addendum Note (Signed)
Addended by: Trellis PaganiniKITCHENS, Tory Septer D on: 06/23/2017 02:55 PM   Modules accepted: Orders

## 2017-06-25 LAB — IGG 1, 2, 3, AND 4
IGG (IMMUNOGLOBIN G), SERUM: 1470 mg/dL (ref 700–1600)
IGG, SUBCLASS 1: 796 mg/dL (ref 248–810)
IgG, Subclass 2: 462 mg/dL (ref 130–555)
IgG, Subclass 3: 78 mg/dL (ref 15–102)
IgG, Subclass 4: 22 mg/dL (ref 2–96)

## 2017-06-25 LAB — SPECIMEN STATUS REPORT

## 2017-06-26 ENCOUNTER — Telehealth: Payer: Self-pay | Admitting: Pulmonary Disease

## 2017-06-26 LAB — PROTEIN ELECTROPHORESIS, SERUM
ALPHA 2: 0.7 g/dL (ref 0.5–0.9)
Albumin ELP: 4.4 g/dL (ref 3.8–4.8)
Alpha 1: 0.3 g/dL (ref 0.2–0.3)
BETA 2: 0.4 g/dL (ref 0.2–0.5)
BETA GLOBULIN: 0.4 g/dL (ref 0.4–0.6)
GAMMA GLOBULIN: 1.5 g/dL (ref 0.8–1.7)
Total Protein: 7.8 g/dL (ref 6.1–8.1)

## 2017-06-26 LAB — SJOGREN'S SYNDROME ANTIBODS(SSA + SSB)
SSA (Ro) (ENA) Antibody, IgG: <1 AI
SSB (La) (ENA) Antibody, IgG: <1 AI

## 2017-06-26 LAB — ANA,IFA RA DIAG PNL W/RFLX TIT/PATN
ANA: NEGATIVE
Cyclic Citrullin Peptide Ab: <16 UNITS

## 2017-06-26 LAB — ALPHA-1 ANTITRYPSIN PHENOTYPE: A-1 Antitrypsin, Ser: 149 mg/dL (ref 83–199)

## 2017-06-26 LAB — IGG, IGA, IGM
IGG (IMMUNOGLOBIN G), SERUM: 1510 mg/dL (ref 694–1618)
IMMUNOGLOBULIN A: 408 mg/dL (ref 81–463)
IgM, Serum: 171 mg/dL (ref 48–271)

## 2017-06-26 LAB — HIV ANTIBODY (ROUTINE TESTING W REFLEX): HIV: NONREACTIVE

## 2017-06-26 NOTE — Telephone Encounter (Signed)
Called Angela Walter and stated to her that Dr. Isaiah SergeMannam had not reviewed the labwork yet but stated to her once he did, we would call and let her know the results.  Angela Walter expressed understanding.  Dr. Isaiah SergeMannam, please advise on the results of Angela Walter's labwork from 6/17. Thanks!

## 2017-06-26 NOTE — Telephone Encounter (Signed)
I have checked Dr. Shirlee MoreMannam's look at. These results have not come back yet.  Spoke with pt. She is requesting the results of the lab work that has come back.  Dr. Isaiah SergeMannam - please advise. Thanks.

## 2017-06-26 NOTE — Telephone Encounter (Signed)
The ones resulted are normal.  We still have Alpha 1 levels, CCP and VEGF results pending. I will call her when I get those results.

## 2017-06-26 NOTE — Telephone Encounter (Signed)
Spoke with pt. She is aware of her results that have come back. Nothing further was needed at this time.

## 2017-06-26 NOTE — Telephone Encounter (Signed)
Is the VEGF levels back yet. [facility]

## 2017-06-30 ENCOUNTER — Telehealth: Payer: Self-pay | Admitting: Pulmonary Disease

## 2017-06-30 NOTE — Telephone Encounter (Signed)
Called [facility] at [phone] and [facility]

## 2017-06-30 NOTE — Telephone Encounter (Signed)
I called and spoke with the patient. Please call the [facility] and check on the status of the VEGF test please.

## 2017-06-30 NOTE — Telephone Encounter (Signed)
Called and spoke to pt.  Pt is requesting lab results from 06/17/17. Pt is aware that VEGF has not came back yet.   Dr. Isaiah SergeMannam please advise on results. Thanks

## 2017-07-01 NOTE — Telephone Encounter (Signed)
Attempted to call [facility] at [phone] regarding update on VEGF test. I did not receive an answer at time of call. I have left a voicemail message for pt to return call. X1

## 2017-07-02 NOTE — Telephone Encounter (Signed)
lmtcb x2 for [facility].

## 2017-07-02 NOTE — Telephone Encounter (Signed)
Attempted to call the number listed to check on test result. I left message to call back.

## 2017-07-03 NOTE — Telephone Encounter (Signed)
Spoke to [facility], who stated that will attempt to reach out to [facility] as well.  Will await call back

## 2017-07-03 NOTE — Telephone Encounter (Signed)
Contacted [facility] at [phone]. I was advised that VEGF has been resulted and will be faxed to our office.  Will wait fax.

## 2017-07-03 NOTE — Telephone Encounter (Signed)
Called the number to [facility] - this is actually an unmonitored extension and was recommended to e-mail for immediate assistance: [email or web address]  E-mail cc'd to [facility] and Dr Isaiah SergeMannam

## 2017-07-03 NOTE — Telephone Encounter (Signed)
E-mail received:  The most recent result for this patient was for a sample collected 06/13/2016 and it has been sent to the authorized fax and email on the requisition: [email or web address] and faxed to [phone]  Thank you  Velta AddisonJane Anderson  Technician [facility] [phone] Fax: [phone]  [email or web address]  [facility] [address]  [facility]  [place], [place] [id number]

## 2017-07-04 ENCOUNTER — Ambulatory Visit: Payer: BLUE CROSS/BLUE SHIELD | Admitting: Pulmonary Disease

## 2017-07-04 ENCOUNTER — Encounter: Payer: Self-pay | Admitting: Pulmonary Disease

## 2017-07-04 VITALS — BP 124/78 | HR 74 | Ht 64.0 in | Wt 137.0 lb

## 2017-07-04 DIAGNOSIS — J849 Interstitial pulmonary disease, unspecified: Secondary | ICD-10-CM | POA: Diagnosis not present

## 2017-07-04 DIAGNOSIS — N2889 Other specified disorders of kidney and ureter: Secondary | ICD-10-CM | POA: Diagnosis not present

## 2017-07-04 NOTE — Telephone Encounter (Signed)
Results and have been received and given to Dr. Isaiah SergeMannam for review. Nothing further is needed.

## 2017-07-04 NOTE — Progress Notes (Addendum)
Angela Walter    [id number]    06/13/1970  Primary Care Physician:Le, Rachelle Horahao P, DO  Referring Physician: Lenell AntuLe, Thao P, DO [address] [place], [place] [address]  Chief complaint: Follow-up for cystic lung disease  HPI: 47 year old significant past medical history.  Complains of dyspnea, fatigue since fall 2018.  Has dyspnea with activity and at rest, productive cough with chest congestion.  She is unable to get full breath.  Symptoms are worse with exercise She is physically active and has run half marathons, and a full marathon the past.  Still runs 4 times a week but is unable to get a full work-up.  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  More recently she had been started on Symbicort by her primary care but is using it only intermittently before exercise.  She has symptoms of rhinitis with clear postnasal drip.  Denies any GERD symptoms. Had labs done at her primary care in March 2019 showing elevated calcium and bicarb for unclear etiology. I do not have these tests to review. In addition she has symptoms of epigastric pain.  She had been given PPI in the past without improvement.  Pets: Has dogs and cats.  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  She has a down comforter Smoking history: Never smoker Travel history: Lived in [place], [place], [place], [place], and Relevant family history: Emphysema- father, Asthma- brother  Interim history: Reviewed high-resolution CT which showed diffuse cystic lesions with mild groundglass opacities and nodules.. She is had some labs sent for work-up of cystic lung disease and is here for review of results and discuss next steps. Complains of progressive dyspnea on exertion.  No cough, sputum production.  Outpatient Encounter Medications as  of 07/04/2017  Medication Sig  . albuterol (PROVENTIL HFA;VENTOLIN HFA) 108 (90 Base) MCG/ACT inhaler Inhale 2 puffs into the lungs every 6 (six) hours as needed for wheezing or shortness of breath.  . Cholecalciferol (VITAMIN D-3) 5000 units TABS Take 10,000 Units by mouth.   . loratadine (CLARITIN) 10 MG tablet Take 10 mg by mouth daily.   No facility-administered encounter medications on file as of 07/04/2017.     Allergies as of 07/04/2017 - Review Complete 07/04/2017  Allergen Reaction Noted  . Ciprofloxacin Anaphylaxis 01/13/2014    Past Medical History:  Diagnosis Date  . Abnormal Pap smear of cervix    colpo  20 years ago  . Hemorrhage    post partum    Past Surgical History:  Procedure Laterality Date  . adnoidectomy    . CESAREAN SECTION    . ENDOMETRIAL ABLATION    . NASAL SINUS SURGERY    . TONSILLECTOMY    . tummy tuck     mini  . URETHRAL DIVERTICULUM REPAIR      Family History  Problem Relation Age of Onset  . Hypertension Mother   . Hyperlipidemia Mother   . Depression Mother   . Anxiety disorder Mother   . Hyperlipidemia Father   . Hypertension Father   . Stroke Father   . COPD Father   . Cancer Maternal Aunt        breast  . Cancer Paternal Aunt        breast  positive Braca 1  .  Cancer Paternal Grandmother        unsure    Social History   Socioeconomic History  . Marital status: Married    Spouse name: Not on file  . Number of children: Not on file  . Years of education: Not on file  . Highest education level: Not on file  Occupational History  . Occupation: Visual merchandiser  . Financial resource strain: Not on file  . Food insecurity:    Worry: Not on file    Inability: Not on file  . Transportation needs:    Medical: Not on file    Non-medical: Not on file  Tobacco Use  . Smoking status: Never Smoker  . Smokeless tobacco: Never Used  Substance and Sexual Activity  . Alcohol use: No    Alcohol/week: 0.0 oz   . Drug use: No  . Sexual activity: Yes    Partners: Male    Comment: husband had vasectomy  Lifestyle  . Physical activity:    Days per week: Not on file    Minutes per session: Not on file  . Stress: Not on file  Relationships  . Social connections:    Talks on phone: Not on file    Gets together: Not on file    Attends religious service: Not on file    Active member of club or organization: Not on file    Attends meetings of clubs or organizations: Not on file    Relationship status: Not on file  . Intimate partner violence:    Fear of current or ex partner: Not on file    Emotionally abused: Not on file    Physically abused: Not on file    Forced sexual activity: Not on file  Other Topics Concern  . Not on file  Social History Narrative  . Not on file    Review of systems: Review of Systems  Constitutional: Negative for fever and chills.  HENT: Negative.   Eyes: Negative for blurred vision.  Respiratory: as per HPI  Cardiovascular: Negative for chest pain and palpitations.  Gastrointestinal: Negative for vomiting, diarrhea, blood per rectum. Positive for epigastric pain Genitourinary: Negative for dysuria, urgency, frequency and hematuria.  Musculoskeletal: Negative for myalgias, back pain and joint pain.  Skin: Negative for itching and rash.  Neurological: Negative for dizziness, tremors, focal weakness, seizures and loss of consciousness.  Endo/Heme/Allergies: Negative for environmental allergies.  Psychiatric/Behavioral: Negative for depression, suicidal ideas and hallucinations.  All other systems reviewed and are negative.  Physical Exam: Blood pressure 124/78, pulse 74, height 5\' 4"  (1.626 m), weight 137 lb (62.1 kg), SpO2 97 %. Gen:      No acute distress HEENT:  EOMI, sclera anicteric Neck:     No masses; no thyromegaly Lungs:    Clear to auscultation bilaterally; normal respiratory effort CV:         Regular rate and rhythm; no murmurs Abd:      +  bowel sounds; soft, non-tender; no palpable masses, no distension Ext:    No edema; adequate peripheral perfusion Skin:      Warm and dry; no rash Neuro: alert and oriented x 3 Psych: normal mood and affect  Data Reviewed: FENO 05/08/2017-10  Stress echo 04/12/2017 Stress ECG and echocardiogram was negative for ischemia  Chest x-ray 10/02/2016-no acute cardiopulmonary abnormality Chest x-ray 03/26/2017-no acute cardiopulmonary abnormality I have reviewed the images personally.  PFTs 05/14/2017 FVC 3.96 [1 9%], FEV1 3.08 [107%], F/F 78, TLC  108%, DLCO 73%, DLCO/VA 67% Minimal obstruction and diffusion impairment.  CBC 05/08/2017-WBC 5.3, eos 2.8%, absolute eosinophil count 150 Blood allergy profile 05/08/2017-IgE 81, RAST panel shows mild allergies to dust mite, cat, dog, tree pollen Metabolic panel 05/08/2017- within normal limits except for K of 3.2  ABG 7.45/32/104/99%  Labs Reviewed labs from primary care 04/11/17 Double-stranded DNA, CCP antibody, ANA, uric acid-all negative.  SPEP, HIV, ANA, CCP, rheumatoid factor, SSA, SSB, quantitative immunoglobulins 06/23/2017-all normal VEGF D 06/26/2017 at [facility].-254 [reference range less than 600] Alpha-1 antitrypsin 06/26/2017-149  Assessment:  Cystic lung disease Dyspnea on exertion Reviewed resolution CT which shows diffuse thin-walled cysts predominantly at the bases with minimal groundglass opacity and scattered nodules  Differential is LAM, LIP.  [place] Noemi Chapel syndrome is possible but she does not have characteristic skin lesions or history of renal tumors.  She is a non-smoker hence smoking-related ILD such as langerhans cell histiocytosis is unlikely. I had a prolonged discussion today with patient and family about next steps.  She is quite symptomatic and would like to get a diagnosis.  She is not in favour of wait and watch approach.  Since a work-up including serologies, VEGF levels are negative the next step would  be to obtain lung tissue We discussed getting a transbronchial biopsy through bronchoscope but chances of getting the diagnosis is low. She prefers to go directly to surgical lung biopsy.  I believe she will be able to tolerate it given her young age, good exercise capacity and minimal changes in PFTs.  Refer her to Dr. Dorris Fetch, cardiothoracic surgery for evaluation. And I will also reach out to experts on cystic lung disease in other areas of the country to see if they have any other suggestions.   More then 1/2 the time of the 40 min visit was spent in counseling and/or coordination of care with the patient and family.  Plan/Recommendations: - Refer for surgical lung biopsy  Chilton Greathouse MD [facility] 07/04/2017, 4:27 PM  CC: Lenell Antu, DO    Addendum Discussed case with Dr. Thana Ates, [facility], Dr. Levora Angel, [facility] They have suggested imaging of the abdomen to rule out renal AML, genetic testing for Birth Noemi Chapel syndrome They also recommend trying a bronchoscopy first as it is a less noninvasive procedure before surgical lung biopsy  I have discussed these recommendations with the patient who agrees on holding off on surgical consult for now I will order an MRI abdomen and referral to genetics.  Cancel cardiothoracic consult for now. Consider bronch after review of these tests.  Chilton Greathouse MD [facility] 07/14/2017, 9:48 AM

## 2017-07-04 NOTE — Patient Instructions (Addendum)
Will refer you to Dr. Dorris FetchHendrickson for consideration of lung biopsy for evaluation of interstitial lung disease In the meantime I will get in touch with my colleagues to see if they have any suggestions I will let you know as soon as I hear from them Follow-up in 2 months.

## 2017-07-14 ENCOUNTER — Telehealth: Payer: Self-pay

## 2017-07-14 ENCOUNTER — Other Ambulatory Visit: Payer: Self-pay | Admitting: Pulmonary Disease

## 2017-07-14 DIAGNOSIS — N2889 Other specified disorders of kidney and ureter: Secondary | ICD-10-CM

## 2017-07-14 NOTE — Telephone Encounter (Signed)
Chilton GreathousePraveen Mannam MD [facility] 07/04/2017, 4:27 PM  CC: Lenell AntuLe, Thao P, DO    Addendum Discussed case with Dr. Thana AtesMcCormack, [facility], Dr. Levora AngelAntin Ozerkis, [facility] They have suggested imaging of the abdomen to rule out renal AML, genetic testing for Birth Noemi ChapelHogg Dube syndrome They also recommend trying a bronchoscopy first as it is a less noninvasive procedure before surgical lung biopsy  I have discussed these recommendations with the patient who agrees on holding off on surgical consult for now I will order an MRI abdomen and referral to genetics.  Cancel cardiothoracic consult for now. Consider bronch after review of these tests.  Chilton GreathousePraveen Mannam MD [facility] 07/14/2017, 9:48 AM     Referral has been placed to Genetics.  MRI has been ordered. Pt is aware that MRI is being ordered vs CT. cardiothoracic has been canceled.  Nothing further is needed.

## 2017-07-14 NOTE — Addendum Note (Signed)
Addended by: Maxwell MarionBLANKENSHIP, MARGIE A on: 07/14/2017 10:05 AM   Modules accepted: Orders

## 2017-07-21 ENCOUNTER — Encounter: Payer: Self-pay | Admitting: Genetic Counselor

## 2017-07-21 ENCOUNTER — Inpatient Hospital Stay: Payer: BLUE CROSS/BLUE SHIELD

## 2017-07-21 ENCOUNTER — Inpatient Hospital Stay: Payer: BLUE CROSS/BLUE SHIELD | Attending: Genetic Counselor | Admitting: Genetic Counselor

## 2017-07-21 DIAGNOSIS — J984 Other disorders of lung: Secondary | ICD-10-CM | POA: Insufficient documentation

## 2017-07-21 DIAGNOSIS — Z803 Family history of malignant neoplasm of breast: Secondary | ICD-10-CM

## 2017-07-21 DIAGNOSIS — Z8481 Family history of carrier of genetic disease: Secondary | ICD-10-CM | POA: Insufficient documentation

## 2017-07-21 DIAGNOSIS — Z1379 Encounter for other screening for genetic and chromosomal anomalies: Secondary | ICD-10-CM

## 2017-07-21 NOTE — Progress Notes (Signed)
REFERRING PROVIDER: Marshell Garfinkel, MD [address] [place], [place] [address]  PRIMARY PROVIDER:  Rikki Spearing P, DO  PRIMARY REASON FOR VISIT:  1. Family history of breast cancer   2. Family history of BRCA gene mutation   3. Multiple idiopathic cysts of lung      HISTORY OF PRESENT ILLNESS:   Angela Walter, a 47 y.o. female, was seen for a [place] cancer genetics consultation at the request of Dr. Vaughan Browner due to a personal history of shortness of breathe due to Lung Cysts, and a family history of cancer.  Angela Walter presents to clinic today to discuss the possibility of a hereditary predisposition to cancer, genetic testing, and to further clarify her future cancer risks, as well as potential cancer risks for family members.   Angela Walter is a 47 y.o. female with no personal history of cancer.  There is a known BRCA mutation in the patient's paternal aunt, and her father's paternal half sister.  Her father had never been tested.  The patient underwent BRCA testing only and was negative in 2016.  This fall, the patient, who is an avid runner, started having SOB during her runs.  She was worked up and thought to have asthma.  She used a rescue inhaler and was on allergy medication which did not seem to help symptoms.  In Feb/March 2019, she was seen through Pulmonology at [facility] and had further work up.  A CT scan was performed which revealed bilateral lung cysts.  She is scheduled for renal imaging tomorrow.  The patient is here to undergo testing for Birt--Hogg-Dube testing.  CANCER HISTORY:   No history exists.     HORMONAL RISK FACTORS:  Menarche was at age 48.  First live birth at age 27.  OCP use for approximately 9 years.  Ovaries intact: yes.  Hysterectomy: no.  Menopausal status: premenopausal.  HRT use: 0 years. Colonoscopy: yes; 2 lifetime polyps. Mammogram within the last year: yes. Number of breast biopsies: 0. Up to date with pelvic exams:  no. Any excessive  radiation exposure in the past:  no  Past Medical History:  Diagnosis Date  . Abnormal Pap smear of cervix    colpo  20 years ago  . Family history of BRCA gene mutation   . Family history of breast cancer   . Hemorrhage    post partum  . Multiple idiopathic cysts of lung     Past Surgical History:  Procedure Laterality Date  . adnoidectomy    . CESAREAN SECTION    . ENDOMETRIAL ABLATION    . NASAL SINUS SURGERY    . TONSILLECTOMY    . tummy tuck     mini  . URETHRAL DIVERTICULUM REPAIR      Social History   Socioeconomic History  . Marital status: Married    Spouse name: Not on file  . Number of children: Not on file  . Years of education: Not on file  . Highest education level: Not on file  Occupational History  . Occupation: Youth worker  . Financial resource strain: Not on file  . Food insecurity:    Worry: Not on file    Inability: Not on file  . Transportation needs:    Medical: Not on file    Non-medical: Not on file  Tobacco Use  . Smoking status: Never Smoker  . Smokeless tobacco: Never Used  Substance and Sexual Activity  . Alcohol use:  No    Alcohol/week: 0.0 oz  . Drug use: No  . Sexual activity: Yes    Partners: Male    Comment: husband had vasectomy  Lifestyle  . Physical activity:    Days per week: Not on file    Minutes per session: Not on file  . Stress: Not on file  Relationships  . Social connections:    Talks on phone: Not on file    Gets together: Not on file    Attends religious service: Not on file    Active member of club or organization: Not on file    Attends meetings of clubs or organizations: Not on file    Relationship status: Not on file  Other Topics Concern  . Not on file  Social History Narrative  . Not on file     FAMILY HISTORY:  We obtained a detailed, 4-generation family history.  Significant diagnoses are listed below: Family History  Problem Relation Age of Onset  . Hypertension  Mother   . Hyperlipidemia Mother   . Depression Mother   . Anxiety disorder Mother   . Hyperlipidemia Father   . Hypertension Father   . Stroke Father   . COPD Father        d. 62  . Cancer Maternal Aunt 72       breast  . BRCA 1/2 Paternal Aunt        BRCA+  . Cancer Paternal Grandmother        d. 22-34 from either stomach vs GYN cancer  . Heart disease Maternal Grandmother        d. 91  . Heart disease Paternal Grandfather        d. 72  . Malignant hyperthermia Son 2       found out during surgery as a toddler  . Healthy Son   . Healthy Son   . HIV Maternal Aunt        mat 1/2  . Breast cancer Paternal Aunt 15       BRCA pos (1/2 sister of patients mother AND her father)  . Breast cancer Other        PGF's sister  . Breast cancer Other        PGF sister    The patient has two sons, one who was diagnosed with malignant hyperthermia at age 20.  She has two brothers who do not have lung disease and have never had cancer.  The patient's father is deceased and her mother is living.  The patient's father had COPD and multiple colon polyps.  He was a 50 year smoker.  He had one brother who died in a car accident, three brothers and a sister who are living and cancer free. The sister has a known BRCA mutation.  The patient's father also has three paternal half sisters.  One of these sisters had breast cancer at 20 and was found to have a BRCA mutation.  Both paternal grandparents are deceased.  The grandmother died at 61-11 years of age due to either stomach or a GYN cancer.  The grandfather died of hear disease.  He has two sisters who had breast cancer, one who is living and one deceased.  The patient's mother is healthy.  She has one full sister who had breast cancer at 33 and died soon afterward.  A maternal half sister died of HIV/AIDS.  The patient's maternal grandmother had an additional three daughters with the patients' paternal grandfather (  same three half siblings mentioned  above).  One had breast cancer at 72 and has a BRCA mutation.  The maternal grandmother is deceased from heart disease.  The biological grandfather was recently found and is living at age 50.  He has other children, but nothing is known about them at this time.  There is no known history of lung disease.  Patient's maternal ancestors are of [place] descent, and paternal ancestors are of [place] descent. There is no reported Ashkenazi Jewish ancestry. There is no known consanguinity.  GENETIC COUNSELING ASSESSMENT: Angela Walter is a 47 y.o. female with a personal history of SOB and lung cysts and a family history of BRCA mutation (patient is negative for this) and breast cancer which is somewhat suggestive of a hereditary cancer syndrome and predisposition to cancer. We, therefore, discussed and recommended the following at today's visit.   Birt-Hogg-Dub syndrome:  Birt-Hogg-Dub syndrome (BHD) is a genetic disorder which usually manifests itself in the skin, lungs, and kidneys. Because BHD is such a rare condition, it is hard to determine how often people with the disorder will develop the different findings. Birt-Hogg-Dub syndrome is caused by mutations in the FLCN gene.  Approximately 91-93% of patients' with a clinical diagnosis of BHD have an identified mutation in [facility].  Therefore, about 7-9% of patients with a clinical diagnosis do not have an identifiable mutation.  Cutaneous (Skin) Findings People with BHD can have certain types of non-malignant skin lesions, including fibrofolliculomas, trichodiscomas, and acrochordons (skin tags). Fibrofolliculomas are unique to BHD. These skin lesions usually develop as a person gets older and can get bigger over time.   Pulmonary (Lung) Findings People with BHD can have cysts in one or both lungs. Some data shows that 84% of patients with BHD had lung cysts on CT imaging. About one-third of people with BHD will have at least one pneumothorax, which occurs  when air collects between the lung and the chest wall. A pneumothorax can cause breathing problems and may even result in a person's lung collapsing.  Renal (Kidney) Findings People with BHD can also develop tumors in their kidneys, which can be malignant (cancerous). Most kidney tumors are bilateral and multifocal, meaning that they occur in both kidneys or in multiple locations in the same kidney. They are also usually slow-growing. There are certain types of kidney tumors that are more common than others in people with BHD. The most common tumors are a combination of oncocytoma and chromophobe cell types, so-called oncocytic hybrid tumor, chromophobe renal cell carcinoma, and renal oncocytoma (benign). About 29%-34% of BHD patients will develop kidney tumors.  The patient has a known BRCA mutation running in her family that she has tested negative for in the past.  She also has a maternal aunt, who had breast cancer at 85 that is unrelated to the BRCA mutation.  We discussed that there are several "moderate risk genes" that can increase the risk for breast cancer and may alter how we would follow and manage her care.  Some of these genes are ATM, CHEK2 and PALB2.    Lastly, we discussed that mild versions of cystic fibrosis (CF) can sometimes cause similar SOB symptoms.  We discussed that carrier screening for CF is routinely performed in the prenatal setting, however, she had her children before this was routinely performed.  While lung cysts are not part of cystic fibrosis, adults with mild versions of this condition can have shortness of breath.  We reviewed the characteristics, features  and inheritance patterns of hereditary cancer syndromes. We also discussed genetic testing, including the appropriate family members to test, the process of testing, insurance coverage and turn-around-time for results. We discussed the implications of a negative, positive and/or variant of uncertain significant  result. We recommended Angela Walter pursue genetic testing for the multi cancer gene panel and CFTR.  The Multi-Gene Panel offered by [facility] includes sequencing and/or deletion duplication testing of the following 83 genes: ALK, APC, ATM, AXIN2,BAP1,  BARD1, BLM, BMPR1A, BRCA1, BRCA2, BRIP1, CASR, CDC73, CDH1, CDK4, CDKN1B, CDKN1C, CDKN2A (p14ARF), CDKN2A (p16INK4a), CEBPA, CHEK2, CTNNA1, DICER1, DIS3L2, EGFR (c.2369C>T, p.Thr790Met variant only), EPCAM (Deletion/duplication testing only), FH, FLCN, GATA2, GPC3, GREM1 (Promoter region deletion/duplication testing only), HOXB13 (c.251G>A, p.Gly84Glu), HRAS, KIT, MAX, MEN1, MET, MITF (c.952G>A, p.Glu318Lys variant only), MLH1, MSH2, MSH3, MSH6, MUTYH, NBN, NF1, NF2, NTHL1, PALB2, PDGFRA, PHOX2B, PMS2, POLD1, POLE, POT1, PRKAR1A, PTCH1, PTEN, RAD50, RAD51C, RAD51D, RB1, RECQL4, RET, RUNX1, SDHAF2, SDHA (sequence changes only), SDHB, SDHC, SDHD, SMAD4, SMARCA4, SMARCB1, SMARCE1, STK11, SUFU, TERT, TERT, TMEM127, TP53, TSC1, TSC2, VHL, WRN and WT1.     Based on Angela Walter's personal history of lung cysts and a family history of breast cancer and a known BRCA mutation, she meets medical criteria for genetic testing. Despite that she meets criteria, she may still have an out of pocket cost. We discussed that if her out of pocket cost for testing is over $100, the laboratory will call and confirm whether she wants to proceed with testing.  If the out of pocket cost of testing is less than $100 she will be billed by the genetic testing laboratory.   PLAN: After considering the risks, benefits, and limitations, Angela Walter  provided informed consent to pursue genetic testing and the blood sample was sent to [facility] for analysis of the Multi-cancer and CFTR panel. Results should be available within approximately 2-3 weeks' time, at which point they will be disclosed by telephone to Angela Walter, as will any additional recommendations warranted by these results.  Angela Walter will receive a summary of her genetic counseling visit and a copy of her results once available. This information will also be available in Epic. We encouraged Angela Walter to remain in contact with cancer genetics annually so that we can continuously update the family history and inform her of any changes in cancer genetics and testing that may be of benefit for her family. Angela Walter questions were answered to her satisfaction today. Our contact information was provided should additional questions or concerns arise.  Lastly, we encouraged Angela Walter to remain in contact with cancer genetics annually so that we can continuously update the family history and inform her of any changes in cancer genetics and testing that may be of benefit for this family.   Ms.  Walter questions were answered to her satisfaction today. Our contact information was provided should additional questions or concerns arise. Thank you for the referral and allowing [place] to share in the care of your patient.   Karen P. Florene Glen, [place], [facility] Certified Genetic Counselor [email or web address] phone: [phone]  The patient was seen for a total of 60 minutes in face-to-face genetic counseling.  This patient was discussed with Drs. Magrinat, Lindi Adie and/or Burr Medico who agrees with the above.    _______________________________________________________________________ For Office Staff:  Number of people involved in session: 1 Was an Intern/ student involved with case: no

## 2017-07-22 ENCOUNTER — Ambulatory Visit
Admission: RE | Admit: 2017-07-22 | Discharge: 2017-07-22 | Disposition: A | Discharge status: Home / Self Care | Payer: BLUE CROSS/BLUE SHIELD | Source: Ambulatory Visit | Attending: Pulmonary Disease | Admitting: Pulmonary Disease

## 2017-07-22 DIAGNOSIS — N2889 Other specified disorders of kidney and ureter: Secondary | ICD-10-CM

## 2017-07-22 MED ORDER — GADOBENATE DIMEGLUMINE 529 MG/ML IV SOLN
12.0000 mL | Freq: Once | INTRAVENOUS | Status: AC | PRN
  Administered 2017-07-22: 12 mL via INTRAVENOUS

## 2017-07-29 ENCOUNTER — Encounter: Payer: Self-pay | Admitting: Genetic Counselor

## 2017-07-29 ENCOUNTER — Encounter: Payer: BLUE CROSS/BLUE SHIELD | Admitting: Thoracic Surgery (Cardiothoracic Vascular Surgery)

## 2017-07-29 ENCOUNTER — Ambulatory Visit: Payer: Self-pay | Admitting: Genetic Counselor

## 2017-07-29 ENCOUNTER — Telehealth: Payer: Self-pay | Admitting: Genetic Counselor

## 2017-07-29 DIAGNOSIS — J984 Other disorders of lung: Secondary | ICD-10-CM

## 2017-07-29 DIAGNOSIS — Z1379 Encounter for other screening for genetic and chromosomal anomalies: Secondary | ICD-10-CM | POA: Insufficient documentation

## 2017-07-29 NOTE — Progress Notes (Signed)
HPI:  Ms. Freundlich was previously seen in the [place] clinic due to a personal history of lung cysts and concerns regarding a hereditary predisposition to cancer. Please refer to our prior cancer genetics clinic note for more information regarding Ms. Danko's medical, social and family histories, and our assessment and recommendations, at the time. Ms. Patalano recent genetic test results were disclosed to her, as were recommendations warranted by these results. These results and recommendations are discussed in more detail below.  CANCER HISTORY:   No history exists.    FAMILY HISTORY:  We obtained a detailed, 4-generation family history.  Significant diagnoses are listed below: Family History  Problem Relation Age of Onset  . Hypertension Mother   . Hyperlipidemia Mother   . Depression Mother   . Anxiety disorder Mother   . Hyperlipidemia Father   . Hypertension Father   . Stroke Father   . COPD Father        d. 24  . Cancer Maternal Aunt 87       breast  . BRCA 1/2 Paternal Aunt        BRCA+  . Cancer Paternal Grandmother        d. 10-34 from either stomach vs GYN cancer  . Heart disease Maternal Grandmother        d. 76  . Heart disease Paternal Grandfather        d. 91  . Malignant hyperthermia Son 2       found out during surgery as a toddler  . Healthy Son   . Healthy Son   . HIV Maternal Aunt        mat 1/2  . Breast cancer Paternal Aunt 81       BRCA pos (1/2 sister of patients mother AND her father)  . Breast cancer Other        PGF's sister  . Breast cancer Other        PGF sister    The patient has two sons, one who was diagnosed with malignant hyperthermia at age 62.  She has two brothers who do not have lung disease and have never had cancer.  The patient's father is deceased and her mother is living.  The patient's father had COPD and multiple colon polyps.  He was a 50 year smoker.  He had one brother who died in a car accident, three  brothers and a sister who are living and cancer free. The sister has a known BRCA mutation.  The patient's father also has three paternal half sisters.  One of these sisters had breast cancer at 56 and was found to have a BRCA mutation.  Both paternal grandparents are deceased.  The grandmother died at 66-34 years of age due to either stomach or a GYN cancer.  The grandfather died of hear disease.  He has two sisters who had breast cancer, one who is living and one deceased.  The patient's mother is healthy.  She has one full sister who had breast cancer at 31 and died soon afterward.  A maternal half sister died of HIV/AIDS.  The patient's maternal grandmother had an additional three daughters with the patients' paternal grandfather (same three half siblings mentioned above).  One had breast cancer at 46 and has a BRCA mutation.  The maternal grandmother is deceased from heart disease.  The biological grandfather was recently found and is living at age 40.  He has other children, but nothing is known  about them at this time.  There is no known history of lung disease.  Patient's maternal ancestors are of [place] descent, and paternal ancestors are of [place] descent. There is no reported Ashkenazi Jewish ancestry. There is no known consanguinity.  GENETIC TEST RESULTS: Genetic testing reported out on July 28, 2017 through the [facility] panel, including CFTR,  found no deleterious mutations.  We repeated the familial hereditary cancer gene mutation within the BRCA genes. Ms. Scheel test for these two genes was normal and did not reveal the familial mutation. We call this result a true negative result because the cancer-causing mutation was identified in Ms. Gonyer's family, and she did not inherit it.  Given this negative result, Ms. Guagliardo chances of developing BRCA-related cancers are the same as they are in the general population.  The Multi-Gene Panel offered by [facility] includes sequencing and/or  deletion duplication testing of the following 85 genes: AIP, ALK, APC, ATM, AXIN2,BAP1,  BARD1, BLM, BMPR1A, BRCA1, BRCA2, BRIP1, CASR, CDC73, CDH1, CDK4, CDKN1B, CDKN1C, CDKN2A (p14ARF), CDKN2A (p16INK4a), CEBPA, CFTR, CHEK2, CTNNA1, DICER1, DIS3L2, EGFR (c.2369C>T, p.Thr790Met variant only), EPCAM (Deletion/duplication testing only), FH, FLCN, GATA2, GPC3, GREM1 (Promoter region deletion/duplication testing only), HOXB13 (c.251G>A, p.Gly84Glu), HRAS, KIT, MAX, MEN1, MET, MITF (c.952G>A, p.Glu318Lys variant only), MLH1, MSH2, MSH3, MSH6, MUTYH, NBN, NF1, NF2, NTHL1, PALB2, PDGFRA, PHOX2B, PMS2, POLD1, POLE, POT1, PRKAR1A, PTCH1, PTEN, RAD50, RAD51C, RAD51D, RB1, RECQL4, RET, RUNX1, SDHAF2, SDHA (sequence changes only), SDHB, SDHC, SDHD, SMAD4, SMARCA4, SMARCB1, SMARCE1, STK11, SUFU, TERT, TERT, TMEM127, TP53, TSC1, TSC2, VHL, WRN and WT1.  The test report has been scanned into EPIC and is located under the Molecular Pathology section of the Results Review tab.    We discussed with Ms. Racine that since the current genetic testing is not perfect, it is possible there may be a gene mutation in one of these genes that current testing cannot detect, but that chance is small.  We also discussed, that it is possible that another gene that has not yet been discovered, or that we have not yet tested, is responsible for the cancer diagnoses in the family, and it is, therefore, important to remain in touch with cancer genetics in the future so that we can continue to offer Ms. Draughon the most up to date genetic testing.   Genetic testing did detect a Variant of Unknown Significance in the MSH3 gene called c.2041C>T (p.Pro681Ser).  At this time, it is unknown if this variant is associated with increased cancer risk or if this is a normal finding, but most variants such as this get reclassified to being inconsequential. It should not be used to make medical management decisions. With time, we suspect the lab will determine  the significance of this variant, if any. If we do learn more about it, we will try to contact Ms. Burkle to discuss it further. However, it is important to stay in touch with [place] periodically and keep the address and phone number up to date.   CANCER SCREENING RECOMMENDATIONS: This normal result is reassuring and indicates that Ms. Spoerl does not likely have an increased risk of cancer due to a mutation in one of these genes.  Specifically, her risk for having Birt-Hogg-Dube has been decreased significantly.  Approximately 7-9% of patients who fulfill clinical diagnostic criteria do not have an identifiable FLCN pathogenic variant.  Ms. Miyamoto does not have a clinical diagnosis of BHD syndrome (defined as five or more facial or truncal papules with at least one histologically  confirmed fibrofolliculoma). She also is negative for the BRCA mutation that has been identified in her family.  We, therefore, recommended  Ms. Rabold continue to follow the population cancer screening guidelines provided by her primary healthcare providers.   An individual's cancer risk and medical management are not determined by genetic test results alone. Overall cancer risk assessment incorporates additional factors, including personal medical history, family history, and any available genetic information that may result in a personalized plan for cancer prevention and surveillance.  RECOMMENDATIONS FOR FAMILY MEMBERS:  Women in this family might be at some increased risk of developing cancer, over the general population risk, simply due to the family history of cancer.  We recommended women in this family have a yearly mammogram beginning at age 88, or 56 years younger than the earliest onset of cancer, an annual clinical breast exam, and perform monthly breast self-exams. Women in this family should also have a gynecological exam as recommended by their primary provider. All family members should have a colonoscopy by age  65.  FOLLOW-UP: Lastly, we discussed with Ms. Nader that cancer genetics is a rapidly advancing field and it is possible that new genetic tests will be appropriate for her and/or her family members in the future. We encouraged her to remain in contact with cancer genetics on an annual basis so we can update her personal and family histories and let her know of advances in cancer genetics that may benefit this family.   Our contact number was provided. Ms. Evans questions were answered to her satisfaction, and she knows she is welcome to call us at anytime with additional questions or concerns.   Roma Kayser, MS, [facility] Certified Genetic Counselor [email or web address]

## 2017-07-29 NOTE — Telephone Encounter (Signed)
Revealed negative genetic testing.  Discussed that we do not know why she has lung cysts.  She is also still negative for the BRCA mutation identified in the family.  It will be important for her to keep in contact with genetics to keep up with whether additional testing may be needed.   Revealed MSH3 VUS.  This is still considered a normal test result.

## 2017-08-12 ENCOUNTER — Telehealth: Payer: Self-pay | Admitting: Pulmonary Disease

## 2017-08-12 DIAGNOSIS — J984 Other disorders of lung: Secondary | ICD-10-CM

## 2017-08-12 NOTE — Telephone Encounter (Signed)
Pt called to report fever to 102 F and chest discomfort noting plan for FOB 08/08. I explained that it is OK to take acetaminophen prior to procedure. Suggested that perhaps Dr Isaiah SergeMannam would want to get a CXR to investigate the chest discomfort  Billy Fischeravid Lanisha Stepanian, MD PCCM service Mobile [phone] Pager [phone] 08/12/2017 11:23 PM

## 2017-08-13 ENCOUNTER — Other Ambulatory Visit: Payer: Self-pay

## 2017-08-13 ENCOUNTER — Ambulatory Visit
Admission: RE | Admit: 2017-08-13 | Discharge: 2017-08-13 | Disposition: A | Discharge status: Home / Self Care | Payer: BLUE CROSS/BLUE SHIELD | Source: Ambulatory Visit | Attending: Pulmonary Disease | Admitting: Pulmonary Disease

## 2017-08-13 ENCOUNTER — Telehealth: Payer: Self-pay | Admitting: Pulmonary Disease

## 2017-08-13 ENCOUNTER — Other Ambulatory Visit: Payer: BLUE CROSS/BLUE SHIELD

## 2017-08-13 ENCOUNTER — Ambulatory Visit (INDEPENDENT_AMBULATORY_CARE_PROVIDER_SITE_OTHER)
Admission: RE | Admit: 2017-08-13 | Discharge: 2017-08-13 | Disposition: A | Discharge status: Home / Self Care | Payer: BLUE CROSS/BLUE SHIELD | Source: Ambulatory Visit | Attending: Pulmonary Disease | Admitting: Pulmonary Disease

## 2017-08-13 DIAGNOSIS — R509 Fever, unspecified: Secondary | ICD-10-CM

## 2017-08-13 DIAGNOSIS — R9389 Abnormal findings on diagnostic imaging of other specified body structures: Secondary | ICD-10-CM | POA: Diagnosis not present

## 2017-08-13 DIAGNOSIS — J984 Other disorders of lung: Secondary | ICD-10-CM

## 2017-08-13 MED ORDER — AZITHROMYCIN 250 MG PO TABS: ORAL_TABLET | ORAL | 0 refills | Status: DC

## 2017-08-13 NOTE — Progress Notes (Unsigned)
hest  

## 2017-08-13 NOTE — Addendum Note (Signed)
Addended by: Christen ButterASKIN, Boaz Berisha M on: 08/13/2017 09:17 AM   Modules accepted: Orders

## 2017-08-13 NOTE — Telephone Encounter (Signed)
Spoke with the pt and notified of recs per Dr. Isaiah SergeMannam  She agrees to come in and have cxr  Order placed

## 2017-08-13 NOTE — Telephone Encounter (Signed)
Yes. Lets get a CXR today. If it is abnormal or fevers persist then we will reschedule the bronch.

## 2017-08-13 NOTE — Telephone Encounter (Signed)
See note dated 08/12/17

## 2017-08-13 NOTE — Progress Notes (Unsigned)
Zpak sent in to pts pharmacy. Dr. Isaiah SergeMannam wanted to reschedule her bronch until he knows fevers have subsided. I will call Delice Bisonara in the morning to reschedule bronch [facility]

## 2017-08-14 ENCOUNTER — Inpatient Hospital Stay (HOSPITAL_COMMUNITY)
Admission: RE | Admit: 2017-08-14 | Discharge: 2017-08-14 | Disposition: A | Discharge status: Home / Self Care | Payer: BLUE CROSS/BLUE SHIELD | Source: Ambulatory Visit

## 2017-08-14 ENCOUNTER — Telehealth: Payer: Self-pay | Admitting: Pulmonary Disease

## 2017-08-14 NOTE — Telephone Encounter (Signed)
I called Angela Walter but Dr. Isaiah SergeMannam already rescheduled the bronch. I called pt to let her know to [facility] bronch. Advised her to arrive 45 minutes earlier. Pt understood and nothing further is needed.

## 2017-08-19 ENCOUNTER — Encounter (HOSPITAL_COMMUNITY)
Admission: RE | Disposition: A | Discharge status: Home / Self Care | Payer: Self-pay | Source: Ambulatory Visit | Attending: Pulmonary Disease

## 2017-08-19 ENCOUNTER — Ambulatory Visit (HOSPITAL_COMMUNITY): Payer: BLUE CROSS/BLUE SHIELD

## 2017-08-19 ENCOUNTER — Encounter (HOSPITAL_COMMUNITY): Payer: Self-pay | Admitting: Respiratory Therapy

## 2017-08-19 ENCOUNTER — Ambulatory Visit (HOSPITAL_COMMUNITY)
Admission: RE | Admit: 2017-08-19 | Discharge: 2017-08-19 | Disposition: A | Discharge status: Home / Self Care | Payer: BLUE CROSS/BLUE SHIELD | Source: Ambulatory Visit | Attending: Pulmonary Disease | Admitting: Pulmonary Disease

## 2017-08-19 DIAGNOSIS — Z8481 Family history of carrier of genetic disease: Secondary | ICD-10-CM | POA: Diagnosis not present

## 2017-08-19 DIAGNOSIS — J849 Interstitial pulmonary disease, unspecified: Secondary | ICD-10-CM

## 2017-08-19 DIAGNOSIS — Z803 Family history of malignant neoplasm of breast: Secondary | ICD-10-CM | POA: Insufficient documentation

## 2017-08-19 DIAGNOSIS — R918 Other nonspecific abnormal finding of lung field: Secondary | ICD-10-CM | POA: Diagnosis not present

## 2017-08-19 DIAGNOSIS — Z9889 Other specified postprocedural states: Secondary | ICD-10-CM

## 2017-08-19 HISTORY — PX: VIDEO BRONCHOSCOPY: SHX5072

## 2017-08-19 LAB — BODY FLUID CELL COUNT WITH DIFFERENTIAL
EOS FL: 2 %
LYMPHS FL: 61 %
MONOCYTE-MACROPHAGE-SEROUS FLUID: 36 % — AB (ref 50–90)
Neutrophil Count, Fluid: 1 % (ref 0–25)
WBC FLUID: 117 uL (ref 0–1000)

## 2017-08-19 SURGERY — BRONCHOSCOPY, WITH FLUOROSCOPY
Anesthesia: Moderate Sedation | Laterality: Bilateral

## 2017-08-19 MED ORDER — SODIUM CHLORIDE 0.9 % IV SOLN
Freq: Once | INTRAVENOUS | Status: AC
  Administered 2017-08-19: 08:00:00 via INTRAVENOUS

## 2017-08-19 MED ORDER — FENTANYL CITRATE (PF) 100 MCG/2ML IJ SOLN
INTRAMUSCULAR | Status: AC
  Filled 2017-08-19: qty 4

## 2017-08-19 MED ORDER — BUTAMBEN-TETRACAINE-BENZOCAINE 2-2-14 % EX AERO: 1.0000 | INHALATION_SPRAY | Freq: Once | CUTANEOUS | Status: DC

## 2017-08-19 MED ORDER — MIDAZOLAM HCL 10 MG/2ML IJ SOLN
INTRAMUSCULAR | Status: DC | PRN
  Administered 2017-08-19 (×5): 1 mg via INTRAVENOUS

## 2017-08-19 MED ORDER — PHENYLEPHRINE HCL 0.25 % NA SOLN
NASAL | Status: DC | PRN
  Administered 2017-08-19: 2 via NASAL

## 2017-08-19 MED ORDER — LIDOCAINE HCL URETHRAL/MUCOSAL 2 % EX GEL
CUTANEOUS | Status: DC | PRN
  Administered 2017-08-19: 1

## 2017-08-19 MED ORDER — MIDAZOLAM HCL 5 MG/ML IJ SOLN
INTRAMUSCULAR | Status: AC
  Filled 2017-08-19: qty 2

## 2017-08-19 MED ORDER — PHENYLEPHRINE HCL 0.25 % NA SOLN: 1.0000 | Freq: Four times a day (QID) | NASAL | Status: DC | PRN

## 2017-08-19 MED ORDER — LIDOCAINE HCL (PF) 1 % IJ SOLN
INTRAMUSCULAR | Status: DC | PRN
  Administered 2017-08-19: 6 mL

## 2017-08-19 MED ORDER — LIDOCAINE HCL 2 % EX GEL: 1.0000 "application " | Freq: Once | CUTANEOUS | Status: DC

## 2017-08-19 MED ORDER — FENTANYL CITRATE (PF) 100 MCG/2ML IJ SOLN
INTRAMUSCULAR | Status: DC | PRN
  Administered 2017-08-19 (×5): 25 ug via INTRAVENOUS

## 2017-08-19 NOTE — Discharge Instructions (Signed)
Flexible Bronchoscopy, Care After These instructions give you information on caring for yourself after your procedure. Your doctor may also give you more specific instructions. Call your doctor if you have any problems or questions after your procedure. Follow these instructions at home:  Do not eat or drink anything for 2 hours after your procedure. If you try to eat or drink before the medicine wears off, food or drink could go into your lungs. You could also burn yourself.  After 2 hours have passed and when you can cough and gag normally, you may eat soft food and drink liquids slowly.  The day after the test, you may eat your normal diet.  You may do your normal activities.  Keep all doctor visits. Get help right away if:  You get more and more short of breath.  You get light-headed.  You feel like you are going to pass out (faint).  You have chest pain.  You have new problems that worry you.  You cough up more than a little blood.  You cough up more blood than before.  Do not eat or drink anything until 11:00 am on 08/19/2017. This information is not intended to replace advice given to you by your health care provider. Make sure you discuss any questions you have with your health care provider. Document Released: 10/21/2008 Document Revised: 06/01/2015 Document Reviewed: 08/28/2012 Elsevier Interactive Patient Education  2017 [employer].

## 2017-08-19 NOTE — Progress Notes (Signed)
Video bronchoscopy performed.  Intervention bronchial washing. Intervention bronchial biopsy.  No complications noted.  Will continue to monitor. 

## 2017-08-19 NOTE — Op Note (Signed)
[facility] Cardiopulmonary Patient Name: Angela Walter Procedure Date: 08/19/2017 MRN: [id number] Attending MD: Marshell Garfinkel , MD Date of Birth: 10/31/1970 CSN: [id number] Age: 47 Admit Type: Outpatient Ethnicity: Not Hispanic or Latino Procedure:            Bronchoscopy Indications:          Interstitial lung disease, Abnormal CT scan of chest Providers:            Marshell Garfinkel, MD, Ashley Mariner RRT,RCP, Phillis Knack                        RRT, RCP Referring MD:          Medicines:            Midazolam 5 mg IV, Fentanyl 562 mcg IV Complications:        No immediate complications Estimated Blood Loss: Estimated blood loss: none. Procedure:      Pre-Anesthesia Assessment:      - A History and Physical has been performed. Patient meds and allergies       have been reviewed. The risks and benefits of the procedure and the       sedation options and risks were discussed with the patient. All       questions were answered and informed consent was obtained. Patient       identification and proposed procedure were verified prior to the       procedure by the physician in the procedure room. Mental Status       Examination: alert and oriented. Airway Examination: normal       oropharyngeal airway. Respiratory Examination: clear to auscultation. CV       Examination: RRR, no murmurs, no S3 or S4. ASA Grade Assessment: II - A       patient with mild systemic disease. After reviewing the risks and       benefits, the patient was deemed in satisfactory condition to undergo       the procedure. The anesthesia plan was to use moderate sedation /       analgesia (conscious sedation). Immediately prior to administration of       medications, the patient was re-assessed for adequacy to receive       sedatives. The heart rate, respiratory rate, oxygen saturations, blood       pressure, adequacy of pulmonary ventilation, and response to care were       monitored throughout the  procedure. The physical status of the patient       was re-assessed after the procedure.      After obtaining informed consent, the bronchoscope was passed under       direct vision. Throughout the procedure, the patient's blood pressure,       pulse, and oxygen saturations were monitored continuously. the BF-H190       ([id number]) Olympus Diagnostic Bronchoscope was introduced through the       left nostril and advanced to the tracheobronchial tree of both lungs.       The procedure was accomplished without difficulty. The patient tolerated       the procedure well. Findings:      The nasopharynx/oropharynx appears normal. The larynx appears normal.       The vocal cords appear normal. The subglottic space is normal. The       trachea is of normal caliber. The carina is sharp. The tracheobronchial  tree was examined to at least the first subsegmental level. Bronchial       mucosa and anatomy are normal; there are no endobronchial lesions, and       no secretions.      Bronchoalveolar lavage was performed in the RML medial segment (B5) of       the lung and sent for cell count and differential and flow cytometry.       180 mL of fluid were instilled. 90 mL were returned. The return was       cloudy. There were no mucoid plugs in the return fluid. Multiple       specimens were obtained and pooled into one specimen, which was sent for       analysis.      Transbronchial biopsies of the lung were performed in the anterior       segment of the right upper lobe, in the medial segment of the right       middle lobe and in the medial basal segment of the right lower lobe       using alligator forceps and sent for histopathology examination. The       procedure was guided by fluoroscopy. Transbronchial biopsy technique was       selected because the sampling site was not visible endoscopically. Eight       biopsy passes were performed. Eight biopsy samples were obtained. Impression:      -  Interstitial lung disease      - Abnormal CT scan of chest      - The airway examination was normal.      - Bronchoalveolar lavage was performed.      - Transbronchial lung biopsies were performed. Moderate Sedation:      Moderate (conscious) sedation was administered by the endoscopy nurse       and supervised by the endoscopist. The following parameters were       monitored: oxygen saturation, heart rate, blood pressure, and response       to care. Total physician intraservice time was 30 minutes. Recommendation:      - Await BAL, biopsy and cytology results. Procedure Code(s):      --- Professional ---      [id number], Bronchoscopy, rigid or flexible, including fluoroscopic guidance,       when performed; with transbronchial lung biopsy(s), single lobe      31497, Bronchoscopy, rigid or flexible, including fluoroscopic guidance,       when performed; with bronchial alveolar lavage      [phone], Bronchoscopy, rigid or flexible, including fluoroscopic guidance,       when performed; with transbronchial lung biopsy(s), each additional lobe       (List separately in addition to code for primary procedure)      [phone], Bronchoscopy, rigid or flexible, including fluoroscopic guidance,       when performed; with transbronchial lung biopsy(s), each additional lobe       (List separately in addition to code for primary procedure)      99152, Moderate sedation services provided by the same physician or       other qualified health care professional performing the diagnostic or       therapeutic service that the sedation supports, requiring the presence       of an independent trained observer to assist in the monitoring of the       patient's level of consciousness and physiological status; initial 15  minutes of intraservice time, patient age 69 years or older      [phone], Moderate sedation services provided by the same physician or       other qualified health care professional performing the  diagnostic or       therapeutic service that the sedation supports, requiring the presence       of an independent trained observer to assist in the monitoring of the       patient's level of consciousness and physiological status; each       additional 15 minutes intraservice time (List separately in addition to       code for primary service) Diagnosis Code(s):      --- Professional ---      R93.89, Abnormal findings on diagnostic imaging of other specified body       structures      J84.9, Interstitial pulmonary disease, unspecified CPT copyright 2017 American Medical Association. All rights reserved. The codes documented in this report are preliminary and upon coder review may  be revised to meet current compliance requirements. Marshell Garfinkel, MD 08/19/2017 9:22:26 AM Number of Addenda: 0 Scope In: 8:51:14 AM Scope Out: 9:03:48 AM

## 2017-08-21 ENCOUNTER — Other Ambulatory Visit: Payer: Self-pay | Admitting: Pulmonary Disease

## 2017-08-21 DIAGNOSIS — J849 Interstitial pulmonary disease, unspecified: Secondary | ICD-10-CM

## 2017-08-21 LAB — CULTURE, BAL-QUANTITATIVE: SPECIAL REQUESTS: NORMAL

## 2017-08-21 LAB — ACID FAST SMEAR (AFB): ACID FAST SMEAR - AFSCU2: NEGATIVE

## 2017-08-21 LAB — CULTURE, BAL-QUANTITATIVE W GRAM STAIN: Culture: NO GROWTH

## 2017-08-21 LAB — PNEUMOCYSTIS JIROVECI SMEAR BY DFA: PNEUMOCYSTIS JIROVECI AG: NEGATIVE

## 2017-09-01 ENCOUNTER — Institutional Professional Consult (permissible substitution): Payer: BLUE CROSS/BLUE SHIELD | Admitting: Cardiothoracic Surgery

## 2017-09-01 VITALS — BP 140/95 | HR 88 | Resp 20 | Ht 64.0 in | Wt 135.0 lb

## 2017-09-01 DIAGNOSIS — J849 Interstitial pulmonary disease, unspecified: Secondary | ICD-10-CM | POA: Diagnosis not present

## 2017-09-01 NOTE — Progress Notes (Signed)
[address]       [place] [address]             [phone]                    Naava Arthurs [place] Medical Record #[id number] Date of Birth: 1970/10/25  Referring: Marshell Garfinkel, MD Primary Care: Glenford Bayley, DO Primary Cardiologist: No primary care provider on file.  Chief Complaint:    Chief Complaint  Patient presents with  . Interstitial Lung Disease    surgical eval, Bronch wtih BX  08/19/17, Chest CT 06/12/17    History of Present Illness:    Angela Walter 47 y.o. female is seen in the office  today for evaluation of fluoroscopy scopic lung biopsy.  Since September October 2018 the patient is noted progressive symptoms of shortness of breath.  She is a lifelong non-smoker.  Is been a long-distance runner for many years.  She now notes that she has become increasingly short of breath with exertion and it sometimes at rest.  She is able to do her usual activities around the house but notes she does become more short of breath climbing stairs.  She is been followed by Dr. Vaughan Browner who is referred her for lung biopsy.  Previous bronchoscopy with bronchial washings did not reveal any specific diagnosis.  Patient has had a high density CT scan of the chest several months ago.    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  She is a avid gardener.    Current Activity/ Functional Status:  Patient is independent with mobility/ambulation, transfers, ADL's, IADL's.   Zubrod Score: At the time of surgery this patient's most appropriate activity status/level should be described as: _0     0    Normal activity, no symptoms _1     1    Restricted in physical strenuous activity but ambulatory, able to do out light work _2     2    Ambulatory and capable of self care, unable to do work activities, up and about               >50 % of waking hours                              _3      3    Only limited self care, in bed greater than 50% of waking hours _4     4    Completely disabled, no self care, confined to bed or chair _5     5    Moribund   Past Medical History:  Diagnosis Date  . Abnormal Pap smear of cervix    colpo  20 years ago  . Family history of BRCA gene mutation   . Family history of breast cancer   . Hemorrhage    post partum  . Multiple idiopathic cysts of lung     Past Surgical History:  Procedure Laterality Date  . adnoidectomy    . CESAREAN SECTION    . ENDOMETRIAL ABLATION    . NASAL SINUS SURGERY    . TONSILLECTOMY    . tummy tuck     mini  . URETHRAL DIVERTICULUM REPAIR    . VIDEO BRONCHOSCOPY Bilateral 08/19/2017   Procedure: VIDEO BRONCHOSCOPY WITH FLUORO;  Surgeon: Marshell Garfinkel, MD;  Location: [facility]  ENDOSCOPY;  Service: Cardiopulmonary;  Laterality: Bilateral;    Family History  Problem Relation Age of Onset  . Hypertension Mother   . Hyperlipidemia Mother   . Depression Mother   . Anxiety disorder Mother   . Hyperlipidemia Father   . Hypertension Father   . Stroke Father   . COPD Father        d. 12  . Cancer Maternal Aunt 63       breast  . BRCA 1/2 Paternal Aunt        BRCA+  . Cancer Paternal Grandmother        d. 35-34 from either stomach vs GYN cancer  . Heart disease Maternal Grandmother        d. 42  . Heart disease Paternal Grandfather        d. 33  . Malignant hyperthermia Son 2       found out during surgery as a toddler  . Healthy Son   . Healthy Son   . HIV Maternal Aunt        mat 1/2  . Breast cancer Paternal Aunt 26       BRCA pos (1/2 sister of patients mother AND her father)  . Breast cancer Other        PGF's sister  . Breast cancer Other        PGF sister     Social History   Tobacco Use  Smoking Status Never Smoker  Smokeless Tobacco Never Used    Social History   Substance and Sexual Activity  Alcohol Use No  . Alcohol/week: 0.0 standard drinks     Allergies  Allergen  Reactions  . Ciprofloxacin Anaphylaxis    Current Outpatient Medications  Medication Sig Dispense Refill  . albuterol (PROVENTIL HFA;VENTOLIN HFA) 108 (90 Base) MCG/ACT inhaler Inhale 2 puffs into the lungs every 6 (six) hours as needed for wheezing or shortness of breath. 1 Inhaler 6  . diclofenac sodium (VOLTAREN) 1 % GEL Apply 1 application topically daily as needed (for pain).     No current facility-administered medications for this visit.     Pertinent items are noted in HPI.   Review of Systems:     Cardiac Review of Systems: [Y] = yes  or   [ N ] = no   Chest Pain [ n   ]  Resting SOB [ y  ] Exertional SOB  Blue.Reese  ]  Orthopnea [ n ]   Pedal Edema [  n ]    Palpitations [ n ] Syncope  [ n ]   Presyncope [   n]   General Review of Systems: [Y] = yes [  ]=no Constitional: recent weight change [  ];  Wt loss over the last 3 months [   ] anorexia [  ]; fatigue [  ]; nausea [  ]; night sweats [  ]; fever [  ]; or chills [  ];           Eye : blurred vision [  ]; diplopia [   ]; vision changes [  ];  Amaurosis fugax[  ]; Resp: cough [  ];  wheezing[  ];  hemoptysis[  ]; shortness of breath[  ]; paroxysmal nocturnal dyspnea[  ]; dyspnea on exertion[  ]; or orthopnea[  ];  GI:  gallstones[  ], vomiting[  ];  dysphagia[  ]; melena[  ];  hematochezia [  ]; heartburn[  ];   Hx  of  Colonoscopy[  ]; GU: kidney stones [  ]; hematuria[  ];   dysuria [  ];  nocturia[  ];  history of     obstruction [  ]; urinary frequency [  ]             Skin: rash, swelling[  ];, hair loss[  ];  peripheral edema[  ];  or itching[  ]; Musculosketetal: myalgias[  ];  joint swelling[  ];  joint erythema[  ];  joint pain[  ];  back pain[  ];  Heme/Lymph: bruising[  ];  bleeding[  ];  anemia[  ];  Neuro: TIA[  ];  headaches[  ];  stroke[  ];  vertigo[  ];  seizures[  ];   paresthesias[  ];  difficulty walking[  ];  Psych:depression[  ]; anxiety[  ];  Endocrine: diabetes[  ];  thyroid dysfunction[  ];  Immunizations:  Flu up to date [  ]; Pneumococcal up to date [  ];  Other:      PHYSICAL EXAMINATION: BP (!) 140/95   Pulse 88   Resp 20   Ht '5\' 4"'$  (1.626 m)   Wt 135 lb (61.2 kg)   SpO2 99% Comment: RA  BMI 23.17 kg/m  General appearance: alert and appears stated age Head: Normocephalic, without obvious abnormality, atraumatic Neck: no adenopathy, no carotid bruit, no JVD, supple, symmetrical, trachea midline and thyroid not enlarged, symmetric, no tenderness/mass/nodules Lymph nodes: Cervical, supraclavicular, and axillary nodes normal. Resp: clear to auscultation bilaterally Back: symmetric, no curvature. ROM normal. No CVA tenderness. Cardio: regular rate and rhythm, S1, S2 normal, no murmur, click, rub or gallop Extremities: extremities normal, atraumatic, no cyanosis or edema Neurologic: Grossly normal  Diagnostic Studies & Laboratory data:     Recent Radiology Findings:   Dg Chest 2 View  Result Date: 08/13/2017 CLINICAL DATA:  Intermittent episodes of cough, chest congestion, shortness of breath, and left-sided chest pain for the past 6-8 weeks. Onset of fever yesterday. Nonsmoker. EXAM: CHEST - 2 VIEW COMPARISON:  Chest x-ray of April 03, 2017 and chest CT scan of June 12, 2017 FINDINGS: The lungs are mildly hyperinflated with hemidiaphragm flattening. The interstitial markings are coarse though stable. There is no alveolar infiltrate or pleural effusion. The heart and pulmonary vascularity are normal. The mediastinum is normal in width. The trachea is midline. The bony thorax exhibits no acute abnormality. IMPRESSION: Mild hyperinflation may be voluntary or may reflect reactive airway disease or chronic bronchitis. No alveolar pneumonia nor CHF. Electronically Signed   By: David  Martinique M.D.   On: 08/13/2017 10:45      EXAM: CT CHEST WITHOUT CONTRAST  TECHNIQUE: Multidetector CT imaging of the chest was performed following the standard protocol without intravenous contrast. High  resolution imaging of the lungs, as well as inspiratory and expiratory imaging, was performed.  COMPARISON:  04/03/2017 chest radiograph.  FINDINGS: Cardiovascular: Normal heart size. Trace pericardial effusion/thickening. Great vessels are normal in course and caliber.  Mediastinum/Nodes: No discrete thyroid nodules. Unremarkable esophagus. No pathologically enlarged axillary, mediastinal or hilar lymph nodes, noting limited sensitivity for the detection of hilar adenopathy on this noncontrast study.  Lungs/Pleura: No pneumothorax. No pleural effusion. Two scattered solid subpleural pulmonary nodules, largest with average diameter 5 mm in the right middle lobe associated with the minor fissure (series 5/image 83). No acute consolidative airspace disease or lung masses. Numerous small round thin-walled air cysts are scattered in both lungs, most prominent in the  lower lobes, largest 1.0 cm in the right lower lobe (series 7/image 124). Mild patchy ground-glass opacities in both lungs, upper lung predominant. No significant regions of traction bronchiectasis, parenchymal banding, architectural distortion or frank honeycombing. No significant air trapping on the expiration sequence.  Upper abdomen: No acute abnormality.  Musculoskeletal: No aggressive appearing focal osseous lesions. Minimal thoracic spondylosis.  IMPRESSION: 1. Numerous small round thin-walled air cysts throughout both lungs. Mild patchy ground-glass opacity, upper lung predominant. Few scattered small subpleural solid nodules. Differential considerations include lymphoid interstitial pneumonia (LIP) or lymphangioleiomyomatosis. Follow-up high-resolution chest CT suggested in 12 months to assess temporal pattern stability. 2. Trace pericardial effusion/thickening.   Electronically Signed   By: Ilona Sorrel M.D.   On: 06/12/2017 14:18  I have independently reviewed the above radiology studies  and  reviewed the findings with the patient.   Recent Lab Findings: Lab Results  Component Value Date   WBC 5.3 05/08/2017   HGB 13.2 05/08/2017   HCT 37.6 05/08/2017   PLT 272.0 05/08/2017   GLUCOSE 122 (H) 05/08/2017   ALT 21 05/08/2017   AST 22 05/08/2017   NA 137 05/08/2017   K 3.2 (L) 05/08/2017   CL 102 05/08/2017   CREATININE 0.76 05/08/2017   BUN 14 05/08/2017   CO2 28 05/08/2017      Assessment / Plan:   Unclassified interstitial pulmonary disease symptomatic-so far no specific diagnosis is been made up discussed with the patient and her husband proceeding with video-assisted thoracoscopy and wedge resection of the right lung to obtain a tissue diagnosis.  Risks and options of surgery are explained in detail and she is willing to proceed tentatively September 11.     I  spent 45 minutes with  the patient face to face and greater then 50% of the time was spent in counseling and coordination of care.    Grace Isaac MD      [address] [place], [address] Office [phone]   Beeper [phone]  09/01/2017 9:35 PM

## 2017-09-02 ENCOUNTER — Telehealth: Payer: Self-pay | Admitting: Pulmonary Disease

## 2017-09-02 ENCOUNTER — Encounter: Payer: Self-pay | Admitting: *Deleted

## 2017-09-02 ENCOUNTER — Other Ambulatory Visit: Payer: Self-pay | Admitting: *Deleted

## 2017-09-02 DIAGNOSIS — J849 Interstitial pulmonary disease, unspecified: Secondary | ICD-10-CM

## 2017-09-02 NOTE — Telephone Encounter (Signed)
Spoke with patient, patient expressed that she is having some anxiety regarding her Biopsy/Bronch that is scheduled for September 11th. Patient wanted to know your recommendation as far as her getting a repeat CT scan since it has been 3 months since her last one and her symptoms have progressively gotten worse. PM please advise as to whether a CT scan is recommended at this time.

## 2017-09-02 NOTE — Telephone Encounter (Signed)
Yes. It is reasonable to get a repeat high res CT of lungs before the surgery as it will enable us to reassess and also guide Dr. Tyrone SageGerhardt during the procedure.  Please order.

## 2017-09-02 NOTE — Telephone Encounter (Signed)
Called patient, unable to reach. Left message to give us a call back.  

## 2017-09-02 NOTE — Telephone Encounter (Signed)
Phoned patient and made aware of recommendations. Order for CT placed. Informed patient we would be calling her to set up appt. Nothing further needed at this time.

## 2017-09-04 ENCOUNTER — Telehealth: Payer: Self-pay | Admitting: Pulmonary Disease

## 2017-09-04 ENCOUNTER — Ambulatory Visit: Payer: BLUE CROSS/BLUE SHIELD | Admitting: Pulmonary Disease

## 2017-09-04 NOTE — Telephone Encounter (Signed)
Spoke with pt. She is scheduled for at CT on 09/10/17. Pt is scheduled for a biopsy on 09/17/17. She would like her CT results before having this biopsy done.  Dr. Isaiah SergeMannam - FYI.

## 2017-09-10 ENCOUNTER — Ambulatory Visit (INDEPENDENT_AMBULATORY_CARE_PROVIDER_SITE_OTHER)
Admission: RE | Admit: 2017-09-10 | Discharge: 2017-09-10 | Disposition: A | Discharge status: Home / Self Care | Payer: BLUE CROSS/BLUE SHIELD | Source: Ambulatory Visit | Attending: Pulmonary Disease | Admitting: Pulmonary Disease

## 2017-09-10 DIAGNOSIS — J849 Interstitial pulmonary disease, unspecified: Secondary | ICD-10-CM | POA: Diagnosis not present

## 2017-09-11 NOTE — H&P (Signed)
Angela Walter    [id number]    [birth year]  Primary Care Physician:Le, Tilden Fossa, DO  Referring Physician: No referring provider defined for this encounter.  Chief complaint: Follow-up for cystic lung disease  HPI: 47 year old significant past medical history.  Complains of dyspnea, fatigue since fall 2018.  Has dyspnea with activity and at rest, productive cough with chest congestion.  She is unable to get full breath.  Symptoms are worse with exercise She is physically active and has run half marathons, and a full marathon the past.  Still runs 4 times a week but is unable to get a full work-up.  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  More recently she had been started on Symbicort by her primary care but is using it only intermittently before exercise.  She has symptoms of rhinitis with clear postnasal drip.  Denies any GERD symptoms. Had labs done at her primary care in March 2019 showing elevated calcium and bicarb for unclear etiology. I do not have these tests to review. In addition she has symptoms of epigastric pain.  She had been given PPI in the past without improvement.  Pets: Has dogs and cats.  No birds, farm animals Occupation: Works from home in [place] Exposures:  No mold, dampness, exposure to [employer], [place].  She has a down comforter Smoking history: Never smoker Travel history: Lived in [place], [place], [place], [employer], and Relevant family history: Emphysema- father, Asthma- brother  Outpatient Encounter Medications as of 07/04/2017  Medication Sig  . albuterol (PROVENTIL HFA;VENTOLIN HFA) 108 (90 Base) MCG/ACT inhaler Inhale 2 puffs into the lungs every 6 (six) hours as needed for wheezing or shortness of breath.  . Cholecalciferol (VITAMIN D-3) 5000 units TABS Take 10,000 Units by mouth.   . loratadine (CLARITIN) 10 MG tablet Take 10 mg by  mouth daily.   No facility-administered encounter medications on file as of 07/04/2017.     Allergies as of 07/30/2017 - Review Complete 07/04/2017  Allergen Reaction Noted  . Ciprofloxacin Anaphylaxis 01/13/2014    Past Medical History:  Diagnosis Date  . Abnormal Pap smear of cervix    colpo  20 years ago  . Family history of BRCA gene mutation   . Family history of breast cancer   . Hemorrhage    post partum  . Multiple idiopathic cysts of lung     Past Surgical History:  Procedure Laterality Date  . adnoidectomy    . CESAREAN SECTION    . ENDOMETRIAL ABLATION    . NASAL SINUS SURGERY    . TONSILLECTOMY    . tummy tuck     mini  . URETHRAL DIVERTICULUM REPAIR    . VIDEO BRONCHOSCOPY Bilateral 08/19/2017   Procedure: VIDEO BRONCHOSCOPY WITH FLUORO;  Surgeon: Marshell Garfinkel, MD;  Location: [facility];  Service: Cardiopulmonary;  Laterality: Bilateral;    Family History  Problem Relation Age of Onset  . Hypertension Mother   . Hyperlipidemia Mother   . Depression Mother   . Anxiety disorder Mother   . Hyperlipidemia Father   . Hypertension Father   . Stroke Father   . COPD Father        d. 47  . Cancer Maternal Aunt 51       breast  . BRCA 1/2 Paternal Aunt        BRCA+  .  Cancer Paternal Grandmother        d. 7-34 from either stomach vs GYN cancer  . Heart disease Maternal Grandmother        d. 55  . Heart disease Paternal Grandfather        d. 22  . Malignant hyperthermia Son 2       found out during surgery as a toddler  . Healthy Son   . Healthy Son   . HIV Maternal Aunt        mat 1/2  . Breast cancer Paternal Aunt 62       BRCA pos (1/2 sister of patients mother AND her father)  . Breast cancer Other        PGF's sister  . Breast cancer Other        PGF sister    Social History   Socioeconomic History  . Marital status: Married    Spouse name: Not on file  . Number of children: Not on file  . Years of education: Not on file  .  Highest education level: Not on file  Occupational History  . Occupation: Youth worker  . Financial resource strain: Not on file  . Food insecurity:    Worry: Not on file    Inability: Not on file  . Transportation needs:    Medical: Not on file    Non-medical: Not on file  Tobacco Use  . Smoking status: Never Smoker  . Smokeless tobacco: Never Used  Substance and Sexual Activity  . Alcohol use: No    Alcohol/week: 0.0 standard drinks  . Drug use: No  . Sexual activity: Yes    Partners: Male    Comment: husband had vasectomy  Lifestyle  . Physical activity:    Days per week: Not on file    Minutes per session: Not on file  . Stress: Not on file  Relationships  . Social connections:    Talks on phone: Not on file    Gets together: Not on file    Attends religious service: Not on file    Active member of club or organization: Not on file    Attends meetings of clubs or organizations: Not on file    Relationship status: Not on file  . Intimate partner violence:    Fear of current or ex partner: Not on file    Emotionally abused: Not on file    Physically abused: Not on file    Forced sexual activity: Not on file  Other Topics Concern  . Not on file  Social History Narrative  . Not on file    Review of systems: Review of Systems  Constitutional: Negative for fever and chills.  HENT: Negative.   Eyes: Negative for blurred vision.  Respiratory: as per HPI  Cardiovascular: Negative for chest pain and palpitations.  Gastrointestinal: Negative for vomiting, diarrhea, blood per rectum. Positive for epigastric pain Genitourinary: Negative for dysuria, urgency, frequency and hematuria.  Musculoskeletal: Negative for myalgias, back pain and joint pain.  Skin: Negative for itching and rash.  Neurological: Negative for dizziness, tremors, focal weakness, seizures and loss of consciousness.  Endo/Heme/Allergies: Negative for environmental allergies.    Psychiatric/Behavioral: Negative for depression, suicidal ideas and hallucinations.  All other systems reviewed and are negative.  Physical Exam: Gen:      No acute distress HEENT:  EOMI, sclera anicteric Neck:     No masses; no thyromegaly Lungs:    Clear to auscultation  bilaterally; normal respiratory effort CV:         Regular rate and rhythm; no murmurs Abd:      + bowel sounds; soft, non-tender; no palpable masses, no distension Ext:    No edema; adequate peripheral perfusion Skin:      Warm and dry; no rash Neuro: alert and oriented x 3 Psych: normal mood and affect  Data Reviewed: FENO 05/08/2017-10  Stress echo 04/12/2017 Stress ECG and echocardiogram was negative for ischemia  Chest x-ray 10/02/2016-no acute cardiopulmonary abnormality Chest x-ray 03/26/2017-no acute cardiopulmonary abnormality I have reviewed the images personally.  PFTs 05/14/2017 FVC 3.96 [1 9%], FEV1 3.08 [107%], F/F 78, TLC 108%, DLCO 73%, DLCO/VA 67% Minimal obstruction and diffusion impairment.  CBC 05/08/2017-WBC 5.3, eos 2.8%, absolute eosinophil count 150 Blood allergy profile 05/08/2017-IgE 81, RAST panel shows mild allergies to dust mite, cat, dog, tree pollen Metabolic panel 06/10/6801- within normal limits except for K of 3.2  ABG 7.45/32/104/99%  Labs Reviewed labs from primary care 04/11/17 Double-stranded DNA, CCP antibody, ANA, uric acid-all negative.  SPEP, HIV, ANA, CCP, rheumatoid factor, SSA, SSB, quantitative immunoglobulins 06/23/2017-all normal VEGF D 06/26/2017 at [facility].-254 [reference range less than 600] Alpha-1 antitrypsin 06/26/2017-149  Assessment:  Cystic lung disease Dyspnea on exertion Reviewed resolution CT which shows diffuse thin-walled cysts predominantly at the bases with minimal groundglass opacity and scattered nodules  Differential is LAM, LIP.  Presents for bronch with biopsy for workup of ILD. Risk benefit discussed with pt. Ok to  proceed.  Marshell Garfinkel MD [facility] 09/11/2017, 1:46 PM

## 2017-09-12 NOTE — Pre-Procedure Instructions (Signed)
Angela Walter  09/12/2017      Your procedure is scheduled on September 17, 2017.  Report to [facility] Admitting at 06:30 A.M.  Call this number if you have problems the morning of surgery:  [phone]   Remember:  Do not eat or drink after midnight.      Take these medicines the morning of surgery with A SIP OF WATER : Inhaler if needed--bring with your day of surgery  7 days prior to surgery STOP taking any Aspirin (unless otherwise instructed by your surgeon), Aleve, Naproxen, Ibuprofen, Motrin, Advil, Goody's, BC's, all herbal medications, fish oil, and all vitamins.    Do not wear jewelry, make-up or nail polish.  Do not wear lotions, powders, or perfumes, or deodorant.  Do not shave 48 hours prior to surgery.  Do not bring valuables to the hospital.  [facility] is not responsible for any belongings or valuables.  Contacts, dentures or bridgework may not be worn into surgery.  Leave your suitcase in the car.  After surgery it may be brought to your room.  For patients admitted to the hospital, discharge time will be determined by your treatment team.  Patients discharged the day of surgery will not be allowed to drive home.   Special instructions:   [facility]- Preparing For Surgery  Before surgery, you can play an important role. Because skin is not sterile, your skin needs to be as free of germs as possible. You can reduce the number of germs on your skin by washing with CHG (chlorahexidine gluconate) Soap before surgery.  CHG is an antiseptic cleaner which kills germs and bonds with the skin to continue killing germs even after washing.    Oral Hygiene is also important to reduce your risk of infection.  Remember - BRUSH YOUR TEETH THE MORNING OF SURGERY WITH YOUR REGULAR TOOTHPASTE  Please do not use if you have an allergy to CHG or antibacterial soaps. If your skin becomes reddened/irritated stop using the CHG.  Do not shave (including legs and  underarms) for at least 48 hours prior to first CHG shower. It is OK to shave your face.  Please follow these instructions carefully.   1. Shower the NIGHT BEFORE SURGERY and the MORNING OF SURGERY with CHG.   2. If you chose to wash your hair, wash your hair first as usual with your normal shampoo.  3. After you shampoo, rinse your hair and body thoroughly to remove the shampoo.  4. Use CHG as you would any other liquid soap. You can apply CHG directly to the skin and wash gently with a scrungie or a clean washcloth.   5. Apply the CHG Soap to your body ONLY FROM THE NECK DOWN.  Do not use on open wounds or open sores. Avoid contact with your eyes, ears, mouth and genitals (private parts). Wash Face and genitals (private parts)  with your normal soap.  6. Wash thoroughly, paying special attention to the area where your surgery will be performed.  7. Thoroughly rinse your body with warm water from the neck down.  8. DO NOT shower/wash with your normal soap after using and rinsing off the CHG Soap.  9. Pat yourself dry with a CLEAN TOWEL.  10. Wear CLEAN PAJAMAS to bed the night before surgery, wear comfortable clothes the morning of surgery  11. Place CLEAN SHEETS on your bed the night of your first shower and DO NOT SLEEP WITH PETS.  Day of Surgery:  Do not apply any deodorants/lotions.  Please wear clean clothes to the hospital/surgery center.   Remember to brush your teeth WITH YOUR REGULAR TOOTHPASTE.    Please read over the following fact sheets that you were given.

## 2017-09-15 ENCOUNTER — Encounter (HOSPITAL_COMMUNITY): Payer: Self-pay

## 2017-09-15 ENCOUNTER — Other Ambulatory Visit: Payer: Self-pay

## 2017-09-15 ENCOUNTER — Inpatient Hospital Stay: Admission: RE | Admit: 2017-09-15 | Payer: BLUE CROSS/BLUE SHIELD | Source: Ambulatory Visit

## 2017-09-15 ENCOUNTER — Encounter (HOSPITAL_COMMUNITY)
Admission: RE | Admit: 2017-09-15 | Discharge: 2017-09-15 | Disposition: A | Discharge status: Home / Self Care | Payer: BLUE CROSS/BLUE SHIELD | Source: Ambulatory Visit | Attending: Cardiothoracic Surgery | Admitting: Cardiothoracic Surgery

## 2017-09-15 ENCOUNTER — Ambulatory Visit (HOSPITAL_COMMUNITY)
Admission: RE | Admit: 2017-09-15 | Discharge: 2017-09-15 | Disposition: A | Discharge status: Home / Self Care | Payer: BLUE CROSS/BLUE SHIELD | Source: Ambulatory Visit | Attending: Cardiothoracic Surgery | Admitting: Cardiothoracic Surgery

## 2017-09-15 DIAGNOSIS — J849 Interstitial pulmonary disease, unspecified: Secondary | ICD-10-CM

## 2017-09-15 DIAGNOSIS — Z01818 Encounter for other preprocedural examination: Secondary | ICD-10-CM | POA: Diagnosis present

## 2017-09-15 HISTORY — DX: Cough, unspecified: R05.9

## 2017-09-15 HISTORY — DX: Dyspnea, unspecified: R06.00

## 2017-09-15 HISTORY — DX: Cough: R05

## 2017-09-15 LAB — CBC
HCT: 37.1 % (ref 36.0–46.0)
Hemoglobin: 12.6 g/dL (ref 12.0–15.0)
MCH: 31.3 pg (ref 26.0–34.0)
MCHC: 34 g/dL (ref 30.0–36.0)
MCV: 92.3 fL (ref 78.0–100.0)
Platelets: 235 10*3/uL (ref 150–400)
RBC: 4.02 MIL/uL (ref 3.87–5.11)
RDW: 13.1 % (ref 11.5–15.5)
WBC: 4.7 10*3/uL (ref 4.0–10.5)

## 2017-09-15 LAB — COMPREHENSIVE METABOLIC PANEL
ALT: 31 U/L (ref 0–44)
AST: 30 U/L (ref 15–41)
Albumin: 3.9 g/dL (ref 3.5–5.0)
Alkaline Phosphatase: 60 U/L (ref 38–126)
Anion gap: 11 (ref 5–15)
BUN: 14 mg/dL (ref 6–20)
CO2: 21 mmol/L — ABNORMAL LOW (ref 22–32)
Calcium: 9.1 mg/dL (ref 8.9–10.3)
Chloride: 106 mmol/L (ref 98–111)
Creatinine, Ser: 0.72 mg/dL (ref 0.44–1.00)
GFR calc Af Amer: >60 mL/min (ref >60)
GFR calc non Af Amer: >60 mL/min (ref >60)
Glucose, Bld: 98 mg/dL (ref 70–99)
Potassium: 3.6 mmol/L (ref 3.5–5.1)
Sodium: 138 mmol/L (ref 135–145)
Total Bilirubin: 0.5 mg/dL (ref 0.3–1.2)
Total Protein: 7.2 g/dL (ref 6.5–8.1)

## 2017-09-15 LAB — URINALYSIS, ROUTINE W REFLEX MICROSCOPIC
Bilirubin Urine: NEGATIVE
Glucose, UA: NEGATIVE mg/dL
Hgb urine dipstick: NEGATIVE
Ketones, ur: NEGATIVE mg/dL
Leukocytes, UA: NEGATIVE
Nitrite: NEGATIVE
Protein, ur: NEGATIVE mg/dL
Specific Gravity, Urine: 1.011 (ref 1.005–1.030)
pH: 6 (ref 5.0–8.0)

## 2017-09-15 LAB — APTT: aPTT: 29 seconds (ref 24–36)

## 2017-09-15 LAB — BLOOD GAS, ARTERIAL
Acid-base deficit: 0.2 mmol/L (ref 0.0–2.0)
Bicarbonate: 23.1 mmol/L (ref 20.0–28.0)
Drawn by: 470591
FIO2: 21
O2 Saturation: 99.5 %
Patient temperature: 98.6
pCO2 arterial: 32.5 mmHg (ref 32.0–48.0)
pH, Arterial: 7.466 — ABNORMAL HIGH (ref 7.350–7.450)
pO2, Arterial: 161 mmHg — ABNORMAL HIGH (ref 83.0–108.0)

## 2017-09-15 LAB — SURGICAL PCR SCREEN
MRSA, PCR: NEGATIVE
STAPHYLOCOCCUS AUREUS: NEGATIVE

## 2017-09-15 LAB — TYPE AND SCREEN
ABO/RH(D): A POS
Antibody Screen: NEGATIVE

## 2017-09-15 LAB — PROTIME-INR
INR: 1.02
Prothrombin Time: 13.3 seconds (ref 11.4–15.2)

## 2017-09-15 LAB — ABO/RH: ABO/RH(D): A POS

## 2017-09-17 ENCOUNTER — Encounter (HOSPITAL_COMMUNITY): Payer: Self-pay | Admitting: *Deleted

## 2017-09-17 ENCOUNTER — Inpatient Hospital Stay (HOSPITAL_COMMUNITY): Payer: BLUE CROSS/BLUE SHIELD | Admitting: Certified Registered"

## 2017-09-17 ENCOUNTER — Encounter (HOSPITAL_COMMUNITY)
Admission: RE | Disposition: A | Discharge status: Home / Self Care | Payer: Self-pay | Source: Home / Self Care | Attending: Cardiothoracic Surgery

## 2017-09-17 ENCOUNTER — Inpatient Hospital Stay (HOSPITAL_COMMUNITY): Payer: BLUE CROSS/BLUE SHIELD

## 2017-09-17 ENCOUNTER — Other Ambulatory Visit: Payer: Self-pay

## 2017-09-17 ENCOUNTER — Inpatient Hospital Stay (HOSPITAL_COMMUNITY)
Admission: RE | Admit: 2017-09-17 | Discharge: 2017-09-20 | DRG: 168 | Disposition: A | Discharge status: Home / Self Care | Payer: BLUE CROSS/BLUE SHIELD | Attending: Cardiothoracic Surgery | Admitting: Cardiothoracic Surgery

## 2017-09-17 DIAGNOSIS — J939 Pneumothorax, unspecified: Secondary | ICD-10-CM

## 2017-09-17 DIAGNOSIS — R0602 Shortness of breath: Secondary | ICD-10-CM | POA: Diagnosis present

## 2017-09-17 DIAGNOSIS — J849 Interstitial pulmonary disease, unspecified: Secondary | ICD-10-CM

## 2017-09-17 DIAGNOSIS — J984 Other disorders of lung: Principal | ICD-10-CM

## 2017-09-17 DIAGNOSIS — Z09 Encounter for follow-up examination after completed treatment for conditions other than malignant neoplasm: Secondary | ICD-10-CM

## 2017-09-17 HISTORY — DX: Family history of other specified conditions: Z84.89

## 2017-09-17 HISTORY — PX: LUNG BIOPSY: SHX5088

## 2017-09-17 HISTORY — PX: VIDEO ASSISTED THORACOSCOPY: SHX5073

## 2017-09-17 HISTORY — PX: VIDEO BRONCHOSCOPY: SHX5072

## 2017-09-17 LAB — GLUCOSE, CAPILLARY
Glucose-Capillary: 144 mg/dL — ABNORMAL HIGH (ref 70–99)
Glucose-Capillary: 145 mg/dL — ABNORMAL HIGH (ref 70–99)
Glucose-Capillary: 158 mg/dL — ABNORMAL HIGH (ref 70–99)
Glucose-Capillary: 166 mg/dL — ABNORMAL HIGH (ref 70–99)

## 2017-09-17 LAB — POCT PREGNANCY, URINE: Preg Test, Ur: NEGATIVE

## 2017-09-17 LAB — MRSA PCR SCREENING: MRSA by PCR: NEGATIVE

## 2017-09-17 SURGERY — BRONCHOSCOPY, VIDEO-ASSISTED
Anesthesia: General | Site: Chest | Laterality: Right

## 2017-09-17 MED ORDER — NALOXONE HCL 0.4 MG/ML IJ SOLN
0.4000 mg | INTRAMUSCULAR | Status: DC | PRN
  Filled 2017-09-17: qty 1

## 2017-09-17 MED ORDER — HYDROMORPHONE HCL 1 MG/ML IJ SOLN
INTRAMUSCULAR | Status: AC
  Filled 2017-09-17: qty 1

## 2017-09-17 MED ORDER — OXYCODONE HCL 5 MG PO TABS
5.0000 mg | ORAL_TABLET | ORAL | Status: DC | PRN
  Administered 2017-09-18: 5 mg via ORAL
  Filled 2017-09-17: qty 1

## 2017-09-17 MED ORDER — SUGAMMADEX SODIUM 200 MG/2ML IV SOLN
INTRAVENOUS | Status: DC | PRN
  Administered 2017-09-17: 150 mg via INTRAVENOUS

## 2017-09-17 MED ORDER — HYDROMORPHONE HCL 1 MG/ML IJ SOLN
0.2500 mg | INTRAMUSCULAR | Status: DC | PRN
  Administered 2017-09-17 (×2): 0.5 mg via INTRAVENOUS

## 2017-09-17 MED ORDER — DEXTROSE-NACL 5-0.45 % IV SOLN
INTRAVENOUS | Status: DC
  Administered 2017-09-17: 16:00:00 via INTRAVENOUS

## 2017-09-17 MED ORDER — SODIUM CHLORIDE 0.9% FLUSH: 9.0000 mL | INTRAVENOUS | Status: DC | PRN

## 2017-09-17 MED ORDER — VANCOMYCIN HCL IN DEXTROSE 1-5 GM/200ML-% IV SOLN
1000.0000 mg | Freq: Two times a day (BID) | INTRAVENOUS | Status: AC
  Administered 2017-09-17: 1000 mg via INTRAVENOUS
  Filled 2017-09-17: qty 200

## 2017-09-17 MED ORDER — FENTANYL CITRATE (PF) 100 MCG/2ML IJ SOLN: 25.0000 ug | INTRAMUSCULAR | Status: DC | PRN

## 2017-09-17 MED ORDER — INSULIN ASPART 100 UNIT/ML ~~LOC~~ SOLN
0.0000 [IU] | SUBCUTANEOUS | Status: DC
  Administered 2017-09-17: 4 [IU] via SUBCUTANEOUS
  Administered 2017-09-17 – 2017-09-18 (×5): 2 [IU] via SUBCUTANEOUS

## 2017-09-17 MED ORDER — DEXAMETHASONE SODIUM PHOSPHATE 10 MG/ML IJ SOLN
INTRAMUSCULAR | Status: DC | PRN
  Administered 2017-09-17: 10 mg via INTRAVENOUS

## 2017-09-17 MED ORDER — ROCURONIUM BROMIDE 10 MG/ML (PF) SYRINGE
PREFILLED_SYRINGE | INTRAVENOUS | Status: DC | PRN
  Administered 2017-09-17: 10 mg via INTRAVENOUS
  Administered 2017-09-17: 50 mg via INTRAVENOUS

## 2017-09-17 MED ORDER — BISACODYL 5 MG PO TBEC
10.0000 mg | DELAYED_RELEASE_TABLET | Freq: Every day | ORAL | Status: DC
  Administered 2017-09-18 – 2017-09-19 (×2): 10 mg via ORAL
  Filled 2017-09-17 (×3): qty 2

## 2017-09-17 MED ORDER — DIPHENHYDRAMINE HCL 50 MG/ML IJ SOLN
12.5000 mg | Freq: Four times a day (QID) | INTRAMUSCULAR | Status: DC | PRN
  Filled 2017-09-17: qty 0.25

## 2017-09-17 MED ORDER — ROCURONIUM BROMIDE 50 MG/5ML IV SOSY
PREFILLED_SYRINGE | INTRAVENOUS | Status: AC
  Filled 2017-09-17: qty 10

## 2017-09-17 MED ORDER — ACETAMINOPHEN 160 MG/5ML PO SOLN: 1000.0000 mg | Freq: Four times a day (QID) | ORAL | Status: DC

## 2017-09-17 MED ORDER — PROMETHAZINE HCL 25 MG/ML IJ SOLN
12.5000 mg | Freq: Four times a day (QID) | INTRAMUSCULAR | Status: DC | PRN
  Administered 2017-09-17 – 2017-09-19 (×2): 12.5 mg via INTRAVENOUS
  Filled 2017-09-17 (×2): qty 1

## 2017-09-17 MED ORDER — ONDANSETRON HCL 4 MG/2ML IJ SOLN
4.0000 mg | Freq: Four times a day (QID) | INTRAMUSCULAR | Status: DC | PRN
  Administered 2017-09-17 – 2017-09-19 (×3): 4 mg via INTRAVENOUS
  Filled 2017-09-17 (×5): qty 2

## 2017-09-17 MED ORDER — FENTANYL CITRATE (PF) 250 MCG/5ML IJ SOLN
INTRAMUSCULAR | Status: AC
  Filled 2017-09-17: qty 5

## 2017-09-17 MED ORDER — POTASSIUM CHLORIDE 10 MEQ/50ML IV SOLN: 10.0000 meq | Freq: Every day | INTRAVENOUS | Status: DC | PRN

## 2017-09-17 MED ORDER — ONDANSETRON HCL 4 MG/2ML IJ SOLN
INTRAMUSCULAR | Status: DC | PRN
  Administered 2017-09-17: 4 mg via INTRAVENOUS

## 2017-09-17 MED ORDER — TRAMADOL HCL 50 MG PO TABS
50.0000 mg | ORAL_TABLET | Freq: Four times a day (QID) | ORAL | Status: DC | PRN
  Administered 2017-09-19 (×2): 100 mg via ORAL
  Filled 2017-09-17 (×2): qty 2

## 2017-09-17 MED ORDER — FENTANYL CITRATE (PF) 100 MCG/2ML IJ SOLN
INTRAMUSCULAR | Status: DC | PRN
  Administered 2017-09-17 (×8): 50 ug via INTRAVENOUS

## 2017-09-17 MED ORDER — CEFAZOLIN SODIUM-DEXTROSE 2-4 GM/100ML-% IV SOLN
2.0000 g | Freq: Three times a day (TID) | INTRAVENOUS | Status: AC
  Administered 2017-09-17 (×2): 2 g via INTRAVENOUS
  Filled 2017-09-17 (×2): qty 100

## 2017-09-17 MED ORDER — SENNOSIDES-DOCUSATE SODIUM 8.6-50 MG PO TABS
1.0000 | ORAL_TABLET | Freq: Every day | ORAL | Status: DC
  Administered 2017-09-18: 1 via ORAL
  Filled 2017-09-17: qty 1

## 2017-09-17 MED ORDER — PROPOFOL 10 MG/ML IV BOLUS
INTRAVENOUS | Status: DC | PRN
  Administered 2017-09-17: 50 mg via INTRAVENOUS
  Administered 2017-09-17: 150 mg via INTRAVENOUS

## 2017-09-17 MED ORDER — LIDOCAINE 2% (20 MG/ML) 5 ML SYRINGE
INTRAMUSCULAR | Status: AC
  Filled 2017-09-17: qty 5

## 2017-09-17 MED ORDER — LIDOCAINE 2% (20 MG/ML) 5 ML SYRINGE
INTRAMUSCULAR | Status: DC | PRN
  Administered 2017-09-17: 60 mg via INTRAVENOUS

## 2017-09-17 MED ORDER — LACTATED RINGERS IV SOLN
INTRAVENOUS | Status: DC | PRN
  Administered 2017-09-17: 08:00:00 via INTRAVENOUS

## 2017-09-17 MED ORDER — ONDANSETRON HCL 4 MG/2ML IJ SOLN
INTRAMUSCULAR | Status: AC
  Filled 2017-09-17: qty 2

## 2017-09-17 MED ORDER — ACETAMINOPHEN 500 MG PO TABS
1000.0000 mg | ORAL_TABLET | Freq: Four times a day (QID) | ORAL | Status: DC
  Administered 2017-09-17 – 2017-09-20 (×10): 1000 mg via ORAL
  Filled 2017-09-17 (×12): qty 2

## 2017-09-17 MED ORDER — 0.9 % SODIUM CHLORIDE (POUR BTL) OPTIME
TOPICAL | Status: DC | PRN
  Administered 2017-09-17: 2000 mL

## 2017-09-17 MED ORDER — CEFAZOLIN SODIUM-DEXTROSE 2-4 GM/100ML-% IV SOLN
2.0000 g | INTRAVENOUS | Status: AC
  Administered 2017-09-17: 2 g via INTRAVENOUS
  Filled 2017-09-17: qty 100

## 2017-09-17 MED ORDER — DIPHENHYDRAMINE HCL 12.5 MG/5ML PO ELIX
12.5000 mg | ORAL_SOLUTION | Freq: Four times a day (QID) | ORAL | Status: DC | PRN
  Administered 2017-09-17: 12.5 mg via ORAL
  Filled 2017-09-17 (×2): qty 5

## 2017-09-17 MED ORDER — FENTANYL 40 MCG/ML IV SOLN
INTRAVENOUS | Status: DC
  Administered 2017-09-17: 15 ug via INTRAVENOUS
  Administered 2017-09-17: 255 ug via INTRAVENOUS
  Administered 2017-09-17: 1000 ug via INTRAVENOUS
  Administered 2017-09-18: 130 ug via INTRAVENOUS
  Administered 2017-09-18: 30 ug via INTRAVENOUS
  Administered 2017-09-18: 45 ug via INTRAVENOUS
  Administered 2017-09-18: 60 ug via INTRAVENOUS
  Administered 2017-09-18: 20 ug via INTRAVENOUS
  Administered 2017-09-19 (×2): 15 ug via INTRAVENOUS
  Filled 2017-09-17 (×2): qty 25

## 2017-09-17 MED ORDER — PROPOFOL 10 MG/ML IV BOLUS
INTRAVENOUS | Status: AC
  Filled 2017-09-17: qty 20

## 2017-09-17 MED ORDER — MIDAZOLAM HCL 2 MG/2ML IJ SOLN
INTRAMUSCULAR | Status: AC
  Filled 2017-09-17: qty 2

## 2017-09-17 MED ORDER — PROMETHAZINE HCL 25 MG/ML IJ SOLN: 6.2500 mg | INTRAMUSCULAR | Status: DC | PRN

## 2017-09-17 MED ORDER — MIDAZOLAM HCL 5 MG/5ML IJ SOLN
INTRAMUSCULAR | Status: DC | PRN
  Administered 2017-09-17: 2 mg via INTRAVENOUS

## 2017-09-17 MED ORDER — ALBUTEROL SULFATE (2.5 MG/3ML) 0.083% IN NEBU: 3.0000 mL | INHALATION_SOLUTION | Freq: Four times a day (QID) | RESPIRATORY_TRACT | Status: DC | PRN

## 2017-09-17 SURGICAL SUPPLY — 80 items
APPLICATOR TIP COSEAL (VASCULAR PRODUCTS) IMPLANT
APPLICATOR TIP EXT COSEAL (VASCULAR PRODUCTS) IMPLANT
BLADE SURG 11 STRL SS (BLADE) IMPLANT
BRUSH CYTOL CELLEBRITY 1.5X140 (MISCELLANEOUS) IMPLANT
CANISTER SUCT 3000ML PPV (MISCELLANEOUS) ×4 IMPLANT
CATH KIT ON Q 5IN SLV (PAIN MANAGEMENT) IMPLANT
CATH THORACIC 28FR (CATHETERS) IMPLANT
CATH THORACIC 36FR (CATHETERS) IMPLANT
CATH THORACIC 36FR RT ANG (CATHETERS) IMPLANT
CLEANER TIP ELECTROSURG 2X2 (MISCELLANEOUS) ×4 IMPLANT
CLIP VESOCCLUDE MED 6/CT (CLIP) ×4 IMPLANT
CONN ST 1/4X3/8  BEN (MISCELLANEOUS) ×1
CONN ST 1/4X3/8 BEN (MISCELLANEOUS) ×3 IMPLANT
CONT SPEC 4OZ CLIKSEAL STRL BL (MISCELLANEOUS) ×20 IMPLANT
COVER BACK TABLE 60X90IN (DRAPES) IMPLANT
DERMABOND ADVANCED (GAUZE/BANDAGES/DRESSINGS) ×1
DERMABOND ADVANCED .7 DNX12 (GAUZE/BANDAGES/DRESSINGS) ×3 IMPLANT
DRAIN CHANNEL 28F RND 3/8 FF (WOUND CARE) ×4 IMPLANT
DRAPE LAPAROSCOPIC ABDOMINAL (DRAPES) ×4 IMPLANT
DRAPE SLUSH/WARMER DISC (DRAPES) ×4 IMPLANT
DRILL BIT 7/64X5 (BIT) IMPLANT
ELECT BLADE 4.0 EZ CLEAN MEGAD (MISCELLANEOUS) ×4
ELECT REM PT RETURN 9FT ADLT (ELECTROSURGICAL) ×4
ELECTRODE BLDE 4.0 EZ CLN MEGD (MISCELLANEOUS) ×3 IMPLANT
ELECTRODE REM PT RTRN 9FT ADLT (ELECTROSURGICAL) ×3 IMPLANT
FORCEPS BIOP RJ4 1.8 (CUTTING FORCEPS) IMPLANT
GAUZE SPONGE 4X4 12PLY STRL (GAUZE/BANDAGES/DRESSINGS) ×4 IMPLANT
GLOVE BIO SURGEON STRL SZ 6.5 (GLOVE) ×16 IMPLANT
GOWN STRL REUS W/ TWL LRG LVL3 (GOWN DISPOSABLE) ×12 IMPLANT
GOWN STRL REUS W/TWL LRG LVL3 (GOWN DISPOSABLE) ×4
KIT BASIN OR (CUSTOM PROCEDURE TRAY) ×4 IMPLANT
KIT CLEAN ENDO COMPLIANCE (KITS) ×4 IMPLANT
KIT SUCTION CATH 14FR (SUCTIONS) ×4 IMPLANT
KIT TURNOVER KIT B (KITS) ×4 IMPLANT
MARKER SKIN DUAL TIP RULER LAB (MISCELLANEOUS) IMPLANT
NS IRRIG 1000ML POUR BTL (IV SOLUTION) ×8 IMPLANT
OIL SILICONE PENTAX (PARTS (SERVICE/REPAIRS)) IMPLANT
PACK CHEST (CUSTOM PROCEDURE TRAY) ×4 IMPLANT
PAD ARMBOARD 7.5X6 YLW CONV (MISCELLANEOUS) ×8 IMPLANT
PASSER SUT SWANSON 36MM LOOP (INSTRUMENTS) IMPLANT
RUBBERBAND STERILE (MISCELLANEOUS) ×4 IMPLANT
SCISSORS LAP 5X35 DISP (ENDOMECHANICALS) ×4 IMPLANT
SEALANT PROGEL (MISCELLANEOUS) IMPLANT
SEALANT SURG COSEAL 4ML (VASCULAR PRODUCTS) IMPLANT
SEALANT SURG COSEAL 8ML (VASCULAR PRODUCTS) IMPLANT
SOLUTION ANTI FOG 6CC (MISCELLANEOUS) ×4 IMPLANT
STAPLE RELOAD 45MM GOLD (STAPLE) ×24 IMPLANT
STAPLER ECHELON POWERED (MISCELLANEOUS) ×4 IMPLANT
SUT PROLENE 3 0 SH DA (SUTURE) IMPLANT
SUT PROLENE 4 0 RB 1 (SUTURE)
SUT PROLENE 4-0 RB1 .5 CRCL 36 (SUTURE) IMPLANT
SUT SILK  1 MH (SUTURE) ×4
SUT SILK 1 MH (SUTURE) ×12 IMPLANT
SUT SILK 2 0SH CR/8 30 (SUTURE) IMPLANT
SUT SILK 3 0SH CR/8 30 (SUTURE) IMPLANT
SUT VIC AB 1 CTX 18 (SUTURE) IMPLANT
SUT VIC AB 1 CTX 36 (SUTURE)
SUT VIC AB 1 CTX36XBRD ANBCTR (SUTURE) IMPLANT
SUT VIC AB 2-0 CTX 36 (SUTURE) IMPLANT
SUT VIC AB 2-0 UR6 27 (SUTURE) ×4 IMPLANT
SUT VIC AB 3-0 X1 27 (SUTURE) IMPLANT
SUT VICRYL 2 TP 1 (SUTURE) IMPLANT
SWAB COLLECTION DEVICE MRSA (MISCELLANEOUS) IMPLANT
SWAB CULTURE ESWAB REG 1ML (MISCELLANEOUS) IMPLANT
SYR 20ML ECCENTRIC (SYRINGE) IMPLANT
SYSTEM SAHARA CHEST DRAIN ATS (WOUND CARE) ×4 IMPLANT
TAPE CLOTH 4X10 WHT NS (GAUZE/BANDAGES/DRESSINGS) ×4 IMPLANT
TAPE CLOTH SURG 4X10 WHT LF (GAUZE/BANDAGES/DRESSINGS) ×4 IMPLANT
TIP APPLICATOR SPRAY EXTEND 16 (VASCULAR PRODUCTS) IMPLANT
TOWEL GREEN STERILE (TOWEL DISPOSABLE) ×4 IMPLANT
TOWEL GREEN STERILE FF (TOWEL DISPOSABLE) ×4 IMPLANT
TRAP SPECIMEN MUCOUS 40CC (MISCELLANEOUS) ×8 IMPLANT
TRAY FOLEY CATH SILVER 16FR LF (SET/KITS/TRAYS/PACK) ×4 IMPLANT
TROCAR BLADELESS 12MM (ENDOMECHANICALS) IMPLANT
TROCAR XCEL BLADELESS 5X75MML (TROCAR) ×4 IMPLANT
TROCAR XCEL BLUNT TIP 100MML (ENDOMECHANICALS) ×8 IMPLANT
TUBE CONNECTING 20X1/4 (TUBING) ×4 IMPLANT
TUNNELER SHEATH ON-Q 11GX8 DSP (PAIN MANAGEMENT) IMPLANT
VALVE DISPOSABLE (MISCELLANEOUS) ×4 IMPLANT
WATER STERILE IRR 1000ML POUR (IV SOLUTION) ×4 IMPLANT

## 2017-09-17 NOTE — H&P (Signed)
[address]       [place] [address]             [phone]                    Angela Walter [place] Medical Record #[id number] Date of Birth: [birth year]  Referring: Marshell Garfinkel, MD Primary Care: Glenford Bayley, DO Primary Cardiologist: No primary care provider on file.  Chief Complaint:    Chief Complaint  Patient presents with  . Interstitial Lung Disease    surgical eval, Bronch wtih BX  08/19/17, Chest CT 06/12/17     History of Present Illness:    Angela Walter 47 y.o. female  Was  seen in the office   for evaluation of lung biopsy.  Since September October 2018 the patient is noted progressive symptoms of shortness of breath.  She is a lifelong non-smoker.  Is been a long-distance runner for many years.  She now notes that she has become increasingly short of breath with exertion and it sometimes at rest.  She is able to do her usual activities around the house but notes she does become more short of breath climbing stairs.  She is been followed by Dr. Vaughan Browner who is referred her for lung biopsy.  Previous bronchoscopy with bronchial washings did not reveal any specific diagnosis.  Patient has had a high density CT scan of the chest several months ago.    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  She is a avid gardener.    Current Activity/ Functional Status:  Patient is independent with mobility/ambulation, transfers, ADL's, IADL's.   Zubrod Score: At the time of surgery this patient's most appropriate activity status/level should be described as: _0     0    Normal activity, no symptoms _1     1    Restricted in physical strenuous activity but ambulatory, able to do out light work _2     2    Ambulatory and capable of self care, unable to do work activities, up and about               >50 % of waking hours                              _3     3    Only  limited self care, in bed greater than 50% of waking hours _4     4    Completely disabled, no self care, confined to bed or chair _5     5    Moribund   Past Medical History:  Diagnosis Date  . Abnormal Pap smear of cervix    colpo  20 years ago  . Cough   . Dyspnea   . Family history of BRCA gene mutation   . Family history of breast cancer   . Hemorrhage    post partum  . Multiple idiopathic cysts of lung     Past Surgical History:  Procedure Laterality Date  . adnoidectomy    . CESAREAN SECTION    . ENDOMETRIAL ABLATION    . NASAL SINUS SURGERY    . TONSILLECTOMY    . tummy tuck     mini  . URETHRAL DIVERTICULUM REPAIR    . VIDEO BRONCHOSCOPY Bilateral 08/19/2017   Procedure: VIDEO  BRONCHOSCOPY WITH FLUORO;  Surgeon: Marshell Garfinkel, MD;  Location: [facility];  Service: Cardiopulmonary;  Laterality: Bilateral;    Family History  Problem Relation Age of Onset  . Hypertension Mother   . Hyperlipidemia Mother   . Depression Mother   . Anxiety disorder Mother   . Hyperlipidemia Father   . Hypertension Father   . Stroke Father   . COPD Father        d. 45  . Cancer Maternal Aunt 64       breast  . BRCA 1/2 Paternal Aunt        BRCA+  . Cancer Paternal Grandmother        d. 48-34 from either stomach vs GYN cancer  . Heart disease Maternal Grandmother        d. 20  . Heart disease Paternal Grandfather        d. 8  . Malignant hyperthermia Son 2       found out during surgery as a toddler  . Healthy Son   . Healthy Son   . HIV Maternal Aunt        mat 1/2  . Breast cancer Paternal Aunt 61       BRCA pos (1/2 sister of patients mother AND her father)  . Breast cancer Other        PGF's sister  . Breast cancer Other        PGF sister     Social History   Tobacco Use  Smoking Status Never Smoker  Smokeless Tobacco Never Used    Social History   Substance and Sexual Activity  Alcohol Use No  . Alcohol/week: 0.0 standard drinks     Allergies    Allergen Reactions  . Ciprofloxacin Anaphylaxis    Current Facility-Administered Medications  Medication Dose Route Frequency Provider Last Rate Last Dose  . ceFAZolin (ANCEF) IVPB 2g/100 mL premix  2 g Intravenous 30 min Pre-Op Grace Isaac, MD        Pertinent items are noted in HPI.   Review of Systems:     Cardiac Review of Systems: [Y] = yes  or   [ N ] = no   Chest Pain [ n   ]  Resting SOB [ y  ] Exertional SOB  Blue.Reese  ]  Orthopnea [ n ]   Pedal Edema [  n ]    Palpitations [ n ] Syncope  [ n ]   Presyncope [   n]   General Review of Systems: [Y] = yes [  ]=no Constitional: recent weight change [  ];  Wt loss over the last 3 months [   ] anorexia [  ]; fatigue [  ]; nausea [  ]; night sweats [  ]; fever [  ]; or chills [  ];           Eye : blurred vision [  ]; diplopia [   ]; vision changes [  ];  Amaurosis fugax[  ]; Resp: cough [  ];  wheezing[  ];  hemoptysis[  ]; shortness of breath[  ]; paroxysmal nocturnal dyspnea[  ]; dyspnea on exertion[  ]; or orthopnea[  ];  GI:  gallstones[  ], vomiting[  ];  dysphagia[  ]; melena[  ];  hematochezia [  ]; heartburn[  ];   Hx of  Colonoscopy[  ]; GU: kidney stones [  ]; hematuria[  ];   dysuria [  ];  nocturia[  ];  history of     obstruction [  ]; urinary frequency [  ]             Skin: rash, swelling[  ];, hair loss[  ];  peripheral edema[  ];  or itching[  ]; Musculosketetal: myalgias[  ];  joint swelling[  ];  joint erythema[  ];  joint pain[  ];  back pain[  ];  Heme/Lymph: bruising[  ];  bleeding[  ];  anemia[  ];  Neuro: TIA[  ];  headaches[  ];  stroke[  ];  vertigo[  ];  seizures[  ];   paresthesias[  ];  difficulty walking[  ];  Psych:depression[  ]; anxiety[  ];  Endocrine: diabetes[  ];  thyroid dysfunction[  ];  Immunizations: Flu up to date [  ]; Pneumococcal up to date [  ];  Other:      PHYSICAL EXAMINATION: There were no vitals taken for this visit. General appearance: alert and appears stated age Head:  Normocephalic, without obvious abnormality, atraumatic Neck: no adenopathy, no carotid bruit, no JVD, supple, symmetrical, trachea midline and thyroid not enlarged, symmetric, no tenderness/mass/nodules Lymph nodes: Cervical, supraclavicular, and axillary nodes normal. Resp: clear to auscultation bilaterally Back: symmetric, no curvature. ROM normal. No CVA tenderness. Cardio: regular rate and rhythm, S1, S2 normal, no murmur, click, rub or gallop Extremities: extremities normal, atraumatic, no cyanosis or edema Neurologic: Grossly normal  Diagnostic Studies & Laboratory data:     Recent Radiology Findings:   Dg Chest 2 View  Result Date: 08/13/2017 CLINICAL DATA:  Intermittent episodes of cough, chest congestion, shortness of breath, and left-sided chest pain for the past 6-8 weeks. Onset of fever yesterday. Nonsmoker. EXAM: CHEST - 2 VIEW COMPARISON:  Chest x-ray of April 03, 2017 and chest CT scan of June 12, 2017 FINDINGS: The lungs are mildly hyperinflated with hemidiaphragm flattening. The interstitial markings are coarse though stable. There is no alveolar infiltrate or pleural effusion. The heart and pulmonary vascularity are normal. The mediastinum is normal in width. The trachea is midline. The bony thorax exhibits no acute abnormality. IMPRESSION: Mild hyperinflation may be voluntary or may reflect reactive airway disease or chronic bronchitis. No alveolar pneumonia nor CHF. Electronically Signed   By: David  Martinique M.D.   On: 08/13/2017 10:45      EXAM: CT CHEST WITHOUT CONTRAST  TECHNIQUE: Multidetector CT imaging of the chest was performed following the standard protocol without intravenous contrast. High resolution imaging of the lungs, as well as inspiratory and expiratory imaging, was performed.  COMPARISON:  04/03/2017 chest radiograph.  FINDINGS: Cardiovascular: Normal heart size. Trace pericardial effusion/thickening. Great vessels are normal in course and  caliber.  Mediastinum/Nodes: No discrete thyroid nodules. Unremarkable esophagus. No pathologically enlarged axillary, mediastinal or hilar lymph nodes, noting limited sensitivity for the detection of hilar adenopathy on this noncontrast study.  Lungs/Pleura: No pneumothorax. No pleural effusion. Two scattered solid subpleural pulmonary nodules, largest with average diameter 5 mm in the right middle lobe associated with the minor fissure (series 5/image 83). No acute consolidative airspace disease or lung masses. Numerous small round thin-walled air cysts are scattered in both lungs, most prominent in the lower lobes, largest 1.0 cm in the right lower lobe (series 7/image 124). Mild patchy ground-glass opacities in both lungs, upper lung predominant. No significant regions of traction bronchiectasis, parenchymal banding, architectural distortion or frank honeycombing. No significant air trapping on the expiration sequence.  Upper abdomen: No acute  abnormality.  Musculoskeletal: No aggressive appearing focal osseous lesions. Minimal thoracic spondylosis.  IMPRESSION: 1. Numerous small round thin-walled air cysts throughout both lungs. Mild patchy ground-glass opacity, upper lung predominant. Few scattered small subpleural solid nodules. Differential considerations include lymphoid interstitial pneumonia (LIP) or lymphangioleiomyomatosis. Follow-up high-resolution chest CT suggested in 12 months to assess temporal pattern stability. 2. Trace pericardial effusion/thickening.   Electronically Signed   By: Ilona Sorrel M.D.   On: 06/12/2017 14:18  I have independently reviewed the above radiology studies  and reviewed the findings with the patient.   Recent Lab Findings: Lab Results  Component Value Date   WBC 4.7 09/15/2017   HGB 12.6 09/15/2017   HCT 37.1 09/15/2017   PLT 235 09/15/2017   GLUCOSE 98 09/15/2017   ALT 31 09/15/2017   AST 30 09/15/2017   NA 138  09/15/2017   K 3.6 09/15/2017   CL 106 09/15/2017   CREATININE 0.72 09/15/2017   BUN 14 09/15/2017   CO2 21 (L) 09/15/2017   INR 1.02 09/15/2017      Assessment / Plan:   Unclassified interstitial pulmonary disease symptomatic-so far no specific diagnosis is been made up discussed with the patient and her husband proceeding with video-assisted thoracoscopy and wedge resection of the right lung to obtain a tissue diagnosis.  Risks and options of surgery are explained in detail and she is willing to proceed.  The goals risks and alternatives of the planned surgical procedure Procedure(s): VIDEO BRONCHOSCOPY (N/A) VIDEO ASSISTED THORACOSCOPY (Right) LUNG BIOPSY (Right)  have been discussed with the patient in detail. The risks of the procedure including death, infection, stroke, myocardial infarction, bleeding, blood transfusion , pneumothorax have all been discussed specifically.  I have quoted Angela Walter a 1 % of perioperative mortality and a complication rate as high as 10 %. The patient's questions have been answered.Angela Walter is willing  to proceed with the planned procedure.     Grace Isaac MD      [address] [place] [address] Office [phone]   Beeper [phone]  09/17/2017 6:45 AM

## 2017-09-17 NOTE — Anesthesia Preprocedure Evaluation (Addendum)
Anesthesia Evaluation  Patient identified by MRN, date of birth, ID band Patient awake    Reviewed: Allergy & Precautions, NPO status , Patient's Chart, lab work & pertinent test results  Airway Mallampati: II  TM Distance: >3 FB Neck ROM: Full    Dental no notable dental hx. (+) Dental Advisory Given, Teeth Intact   Pulmonary shortness of breath,    Pulmonary exam normal breath sounds clear to auscultation       Cardiovascular negative cardio ROS Normal cardiovascular exam Rhythm:Regular Rate:Normal     Neuro/Psych negative neurological ROS  negative psych ROS   GI/Hepatic negative GI ROS, Neg liver ROS,   Endo/Other  negative endocrine ROS  Renal/GU negative Renal ROS  negative genitourinary   Musculoskeletal negative musculoskeletal ROS (+)   Abdominal   Peds negative pediatric ROS (+)  Hematology negative hematology ROS (+)   Anesthesia Other Findings   Reproductive/Obstetrics negative OB ROS                            Anesthesia Physical Anesthesia Plan  ASA: II  Anesthesia Plan: General   Post-op Pain Management:    Induction: Intravenous  PONV Risk Score and Plan: 3 and Ondansetron, Dexamethasone and Treatment may vary due to age or medical condition  Airway Management Planned: Double Lumen EBT  Additional Equipment:   Intra-op Plan:   Post-operative Plan: Extubation in OR  Informed Consent: I have reviewed the patients History and Physical, chart, labs and discussed the procedure including the risks, benefits and alternatives for the proposed anesthesia with the patient or authorized representative who has indicated his/her understanding and acceptance.   Dental advisory given  Plan Discussed with: CRNA and Surgeon  Anesthesia Plan Comments:         Anesthesia Quick Evaluation

## 2017-09-17 NOTE — Transfer of Care (Signed)
Immediate Anesthesia Transfer of Care Note  Patient: Angela Walter  Procedure(s) Performed: BRONCHOSCOPY (N/A ) RIGHT VIDEO ASSISTED THORACOSCOPY WITH LUNG BX X 4 (Right Chest) LUNG BIOPSY (Right )  Patient Location: PACU  Anesthesia Type:General  Level of Consciousness: awake, alert  and oriented  Airway & Oxygen Therapy: Patient Spontanous Breathing and Patient connected to face mask oxygen  Post-op Assessment: Report given to RN, Post -op Vital signs reviewed and stable and Patient moving all extremities X 4  Post vital signs: Reviewed and stable  Last Vitals:  Vitals Value Taken Time  BP 130/94 09/17/2017 10:47 AM  Temp    Pulse 70 09/17/2017 10:49 AM  Resp 17 09/17/2017 10:49 AM  SpO2 97 % 09/17/2017 10:49 AM  Vitals shown include unvalidated device data.  Last Pain:  Vitals:   09/17/17 0647  TempSrc:   PainSc: 0-No pain      Patients Stated Pain Goal: 3 (09/17/17 2563)  Complications: No apparent anesthesia complications

## 2017-09-17 NOTE — Anesthesia Postprocedure Evaluation (Signed)
Anesthesia Post Note  Patient: DAWNIEL LASICH  Procedure(s) Performed: BRONCHOSCOPY (N/A ) RIGHT VIDEO ASSISTED THORACOSCOPY WITH LUNG BX X 4 (Right Chest) LUNG BIOPSY (Right )     Patient location during evaluation: PACU Anesthesia Type: General Level of consciousness: awake and alert Pain management: pain level controlled Vital Signs Assessment: post-procedure vital signs reviewed and stable Respiratory status: spontaneous breathing, nonlabored ventilation, respiratory function stable and patient connected to nasal cannula oxygen Cardiovascular status: blood pressure returned to baseline and stable Postop Assessment: no apparent nausea or vomiting Anesthetic complications: no    Last Vitals:  Vitals:   09/17/17 1132 09/17/17 1147  BP: 135/89 128/85  Pulse: 70 67  Resp: (!) 22 (!) 21  Temp:  36.6 C  SpO2: 100% 97%    Last Pain:  Vitals:   09/17/17 1147  TempSrc:   PainSc: 4                  Arkeem Harts S

## 2017-09-17 NOTE — Progress Notes (Signed)
Fentanyl 40 mcg/mL 25 mL PCA syringe returned unused. Wasted in Main pharmacy's sharps container. Webb Silversmith, CPhT, is witness.

## 2017-09-17 NOTE — Anesthesia Procedure Notes (Signed)
Arterial Line Insertion Start/End9/11/2017 8:05 AM Performed by: Lanell Matar, CRNA, CRNA  Preanesthetic checklist: patient identified, IV checked, site marked, risks and benefits discussed, surgical consent, monitors and equipment checked, pre-op evaluation, timeout performed and anesthesia consent Lidocaine 1% used for infiltration and patient sedated Right, radial was placed Catheter size: 20 G Hand hygiene performed  and maximum sterile barriers used   Attempts: 1 Procedure performed without using ultrasound guided technique. Following insertion, Biopatch and dressing applied. Post procedure assessment: normal  Patient tolerated the procedure well with no immediate complications.

## 2017-09-17 NOTE — Brief Op Note (Addendum)
       [address]       [place] [address]             [phone]         [address] [place]       [name] [address]             [phone]     09/17/2017  10:22 AM  PATIENT:  Angela Walter  47 y.o. female  PRE-OPERATIVE DIAGNOSIS:  Cystic Lung Disease , Poss LAM, LIP  POST-OPERATIVE DIAGNOSIS:  Same   PROCEDURE:  Procedure(s): VIDEO BRONCHOSCOPY (N/A) VIDEO ASSISTED THORACOSCOPY (Right) LUNG BIOPSY (Right) x 4   SURGEON:  Surgeon(s) and Role:    * Delight Ovens, MD - Primary  PHYSICIAN ASSISTANT:  Jari Favre, PA-C   ANESTHESIA:   general  EBL:  50 mL   BLOOD ADMINISTERED:none  DRAINS: ROUTINE   LOCAL MEDICATIONS USED:  NONE  SPECIMEN:  Source of Specimen:  UPPER AND LOWER LOBE BIOPIES right and culture   DISPOSITION OF SPECIMEN:  PATHOLOGY and micro   COUNTS:  YES   DICTATION: .Dragon Dictation  PLAN OF CARE: Admit to inpatient   PATIENT DISPOSITION:  PACU - hemodynamically stable.   Delay start of Pharmacological VTE agent (>24hrs) due to surgical blood loss or risk of bleeding: no

## 2017-09-17 NOTE — OR Nursing (Signed)
6073 to 0850 video bronch performed per Dr. Tyrone Sage via ETT. No  Drainage noted from tube after procedure completed.

## 2017-09-17 NOTE — Anesthesia Procedure Notes (Signed)
Procedure Name: Intubation Date/Time: 09/17/2017 8:44 AM Performed by: Lanell Matar, CRNA Pre-anesthesia Checklist: Patient identified, Emergency Drugs available, Suction available and Patient being monitored Patient Re-evaluated:Patient Re-evaluated prior to induction Oxygen Delivery Method: Circle System Utilized Preoxygenation: Pre-oxygenation with 100% oxygen Induction Type: IV induction Ventilation: Mask ventilation without difficulty Laryngoscope Size: Miller and 2 Grade View: Grade I Tube type: Oral Endobronchial tube: Double lumen EBT and Left and 37 Fr Number of attempts: 1 Airway Equipment and Method: Stylet and Oral airway Placement Confirmation: ETT inserted through vocal cords under direct vision,  positive ETCO2 and breath sounds checked- equal and bilateral Tube secured with: Tape Dental Injury: Teeth and Oropharynx as per pre-operative assessment

## 2017-09-17 NOTE — Progress Notes (Signed)
Patient ID: Angela Walter, female   DOB: 09-Mar-1970, 47 y.o.   MRN: [id number] EVENING ROUNDS NOTE :     [address]       [name] [address]             [phone]                 Day of Surgery Procedure(s) (LRB): BRONCHOSCOPY (N/A) RIGHT VIDEO ASSISTED THORACOSCOPY WITH LUNG BX X 4 (Right) LUNG BIOPSY (Right)  Total Length of Stay:  LOS: 0 days  BP 120/80 (BP Location: Left Arm)   Pulse 70   Temp 98 F (36.7 C) (Oral)   Resp (!) 27   Ht 5\' 4"  (1.626 m)   Wt 64.5 kg   SpO2 97%   BMI 24.41 kg/m   .Intake/Output      09/10 0701 - 09/11 0700 09/11 0701 - 09/12 0700   Urine (mL/kg/hr)  250 (0.6)   Blood  50   Total Output  300   Net  -300          .  ceFAZolin (ANCEF) IV    . dextrose 5 % and 0.45% NaCl    . potassium chloride    . vancomycin 1,000 mg (09/17/17 1345)     Lab Results  Component Value Date   WBC 4.7 09/15/2017   HGB 12.6 09/15/2017   HCT 37.1 09/15/2017   PLT 235 09/15/2017   GLUCOSE 98 09/15/2017   ALT 31 09/15/2017   AST 30 09/15/2017   NA 138 09/15/2017   K 3.6 09/15/2017   CL 106 09/15/2017   CREATININE 0.72 09/15/2017   BUN 14 09/15/2017   CO2 21 (L) 09/15/2017   INR 1.02 09/15/2017   Stable post op No air leak D/c Benedict Needy MD  [facility] [phone] Office [phone] 09/17/2017 2:01 PM

## 2017-09-18 ENCOUNTER — Encounter (HOSPITAL_COMMUNITY): Payer: Self-pay | Admitting: Cardiothoracic Surgery

## 2017-09-18 ENCOUNTER — Other Ambulatory Visit: Payer: Self-pay

## 2017-09-18 ENCOUNTER — Inpatient Hospital Stay (HOSPITAL_COMMUNITY): Payer: BLUE CROSS/BLUE SHIELD

## 2017-09-18 LAB — BASIC METABOLIC PANEL
ANION GAP: 9 (ref 5–15)
BUN: 6 mg/dL (ref 6–20)
CALCIUM: 8.6 mg/dL — AB (ref 8.9–10.3)
CO2: 22 mmol/L (ref 22–32)
Chloride: 106 mmol/L (ref 98–111)
Creatinine, Ser: 0.76 mg/dL (ref 0.44–1.00)
GFR calc Af Amer: >60 mL/min (ref >60)
GFR calc non Af Amer: >60 mL/min (ref >60)
GLUCOSE: 124 mg/dL — AB (ref 70–99)
Potassium: 3.6 mmol/L (ref 3.5–5.1)
Sodium: 137 mmol/L (ref 135–145)

## 2017-09-18 LAB — CBC
HEMATOCRIT: 34.4 % — AB (ref 36.0–46.0)
Hemoglobin: 11.6 g/dL — ABNORMAL LOW (ref 12.0–15.0)
MCH: 30.9 pg (ref 26.0–34.0)
MCHC: 33.7 g/dL (ref 30.0–36.0)
MCV: 91.5 fL (ref 78.0–100.0)
Platelets: 244 10*3/uL (ref 150–400)
RBC: 3.76 MIL/uL — ABNORMAL LOW (ref 3.87–5.11)
RDW: 12.9 % (ref 11.5–15.5)
WBC: 8.3 10*3/uL (ref 4.0–10.5)

## 2017-09-18 LAB — GLUCOSE, CAPILLARY
Glucose-Capillary: 120 mg/dL — ABNORMAL HIGH (ref 70–99)
Glucose-Capillary: 106 mg/dL — ABNORMAL HIGH (ref 70–99)
Glucose-Capillary: 89 mg/dL (ref 70–99)
Glucose-Capillary: 124 mg/dL — ABNORMAL HIGH (ref 70–99)
Glucose-Capillary: 94 mg/dL (ref 70–99)
Glucose-Capillary: 93 mg/dL (ref 70–99)

## 2017-09-18 LAB — ACID FAST SMEAR (AFB, MYCOBACTERIA): Acid Fast Smear: NEGATIVE

## 2017-09-18 MED ORDER — POTASSIUM CHLORIDE CRYS ER 20 MEQ PO TBCR
40.0000 meq | EXTENDED_RELEASE_TABLET | Freq: Once | ORAL | Status: AC
  Administered 2017-09-18: 40 meq via ORAL
  Filled 2017-09-18: qty 2

## 2017-09-18 NOTE — Progress Notes (Addendum)
      [address]       [place] [id number]             [phone]       1 Day Post-Op Procedure(s) (LRB): BRONCHOSCOPY (N/A) RIGHT VIDEO ASSISTED THORACOSCOPY WITH LUNG BX X 4 (Right) LUNG BIOPSY (Right)  Subjective: Patient states pain is "ok". She denies nausea and would like diet advanced.   Objective: Vital signs in last 24 hours: Temp:  [97.5 F (36.4 C)-98.5 F (36.9 C)] 98.5 F (36.9 C) (09/12 0617) Pulse Rate:  [57-75] 57 (09/12 0617) Cardiac Rhythm: Normal sinus rhythm (09/12 0700) Resp:  [17-36] 24 (09/12 0617) BP: (103-135)/(66-94) 103/69 (09/12 0617) SpO2:  [94 %-100 %] 99 % (09/12 0617) Arterial Line BP: (117-153)/(59-83) 135/70 (09/11 1500)      Intake/Output from previous day: 09/11 0701 - 09/12 0700 In: 1101.4 [I.V.:774.9; IV Piggyback:326.6] Out: 899 [Urine:775; Blood:50; Chest Tube:74]   Physical Exam:  Cardiovascular: RRR./SB Pulmonary: Clear to auscultation bilaterally Abdomen: Soft, non tender, bowel sounds present. Extremities: SCDs in place Wounds: Dressing is clean and dry.    Chest Tube: to suction, no air leak  Lab Results: CBC: Recent Labs    09/15/17 1318 09/18/17 0231  WBC 4.7 8.3  HGB 12.6 11.6*  HCT 37.1 34.4*  PLT 235 244   BMET:  Recent Labs    09/15/17 1318 09/18/17 0231  NA 138 137  K 3.6 3.6  CL 106 106  CO2 21* 22  GLUCOSE 98 124*  BUN 14 6  CREATININE 0.72 0.76  CALCIUM 9.1 8.6*    PT/INR:  Recent Labs    09/15/17 1318  LABPROT 13.3  INR 1.02   ABG:  INR: Will add last result for INR, ABG once components are confirmed Will add last 4 CBG results once components are confirmed  Assessment/Plan:  1. CV - SB in the 50's this am 2.  Pulmonary - Chest tube with 74 cc of output since surgery. Chest tube is to suction. There is no air leak. CXR this am appears stable. Hope to place chest tube to water seal. Check CXR in am. Await final pathology. 3. Decrease IVF, advance diet,  remove foley 4. Anemia-H and H 11.6 and 34.4 5. Supplement potassium  Angela M ZimmermanPA-C 09/18/2017,7:29 AM [phone]  Chest xray ok, no air leak, will d/c chest tube today  Path pending  I have seen and examined Angela Walter and agree with the above assessment  and plan.  Angela OvensEdward B Yuritza Paulhus MD Bee[facility] [phone] Office [phone] 09/18/2017 8:23 AM

## 2017-09-18 NOTE — Plan of Care (Signed)
Foley catheter removed, patient ambulated in hallway, and chest tube removed.  Patient has tolerated well. Will continue to monitor.

## 2017-09-19 ENCOUNTER — Inpatient Hospital Stay (HOSPITAL_COMMUNITY): Payer: BLUE CROSS/BLUE SHIELD

## 2017-09-19 LAB — CBC
HCT: 33.5 % — ABNORMAL LOW (ref 36.0–46.0)
Hemoglobin: 11.3 g/dL — ABNORMAL LOW (ref 12.0–15.0)
MCH: 31.6 pg (ref 26.0–34.0)
MCHC: 33.7 g/dL (ref 30.0–36.0)
MCV: 93.6 fL (ref 78.0–100.0)
Platelets: 194 10*3/uL (ref 150–400)
RBC: 3.58 MIL/uL — ABNORMAL LOW (ref 3.87–5.11)
RDW: 13.3 % (ref 11.5–15.5)
WBC: 6 10*3/uL (ref 4.0–10.5)

## 2017-09-19 LAB — GLUCOSE, CAPILLARY
Glucose-Capillary: 91 mg/dL (ref 70–99)
Glucose-Capillary: 103 mg/dL — ABNORMAL HIGH (ref 70–99)
Glucose-Capillary: 89 mg/dL (ref 70–99)
Glucose-Capillary: 101 mg/dL — ABNORMAL HIGH (ref 70–99)
Glucose-Capillary: 110 mg/dL — ABNORMAL HIGH (ref 70–99)
Glucose-Capillary: 85 mg/dL (ref 70–99)

## 2017-09-19 LAB — COMPREHENSIVE METABOLIC PANEL
ALBUMIN: 3.2 g/dL — AB (ref 3.5–5.0)
ALT: 28 U/L (ref 0–44)
ANION GAP: 5 (ref 5–15)
AST: 29 U/L (ref 15–41)
Alkaline Phosphatase: 45 U/L (ref 38–126)
BILIRUBIN TOTAL: 0.7 mg/dL (ref 0.3–1.2)
BUN: 9 mg/dL (ref 6–20)
CO2: 25 mmol/L (ref 22–32)
Calcium: 8.7 mg/dL — ABNORMAL LOW (ref 8.9–10.3)
Chloride: 107 mmol/L (ref 98–111)
Creatinine, Ser: 0.73 mg/dL (ref 0.44–1.00)
GFR calc Af Amer: >60 mL/min (ref >60)
GFR calc non Af Amer: >60 mL/min (ref >60)
GLUCOSE: 103 mg/dL — AB (ref 70–99)
Potassium: 3.7 mmol/L (ref 3.5–5.1)
Sodium: 137 mmol/L (ref 135–145)
TOTAL PROTEIN: 6 g/dL — AB (ref 6.5–8.1)

## 2017-09-19 LAB — FUNGAL ORGANISM REFLEX

## 2017-09-19 LAB — FUNGUS CULTURE WITH STAIN

## 2017-09-19 LAB — FUNGUS CULTURE RESULT

## 2017-09-19 MED ORDER — ONDANSETRON 4 MG PO TBDP: 4.0000 mg | ORAL_TABLET | Freq: Four times a day (QID) | ORAL | Status: DC | PRN

## 2017-09-19 MED ORDER — ENOXAPARIN SODIUM 30 MG/0.3ML ~~LOC~~ SOLN
30.0000 mg | Freq: Every day | SUBCUTANEOUS | Status: DC
  Administered 2017-09-19 – 2017-09-20 (×2): 30 mg via SUBCUTANEOUS
  Filled 2017-09-19 (×2): qty 0.3

## 2017-09-19 NOTE — Discharge Summary (Signed)
Physician Discharge Summary  Patient ID: Angela Walter MRN: [id number] DOB/AGE: 07/30/1970 47 y.o.  Admit date: 09/17/2017 Discharge date: 09/20/2017  Admission Diagnoses:  Patient Active Problem List   Diagnosis Date Noted  . Lung abnormality 09/17/2017  . ILD (interstitial lung disease) ([facility])   . Genetic testing 07/29/2017  . Family history of breast cancer   . Family history of BRCA gene mutation   . Multiple idiopathic cysts of lung   . Family history of BRCA gene positive 01/13/2014  . Urinary incontinence in female 01/13/2014   Discharge Diagnoses:   Patient Active Problem List   Diagnosis Date Noted  . Lung abnormality 09/17/2017  . ILD (interstitial lung disease) ([facility])   . Genetic testing 07/29/2017  . Family history of breast cancer   . Family history of BRCA gene mutation   . Multiple idiopathic cysts of lung   . Family history of BRCA gene positive 01/13/2014  . Urinary incontinence in female 01/13/2014   Discharged Condition: good  History of Present Illness:  Angela Walter is a 47 yo female who was referred to [facility] for lung biopsy for Interstitial Lung Disease. She is a lifelong non-smoker   She originally developed progressive symptoms of shortness of breath in October of 2018.  She has been a long distance runner for many years, but noticed she had becoming more short of breath with exertion and at rest.  She is able to do her regular activities around the house, but she does admit to being short of breath while climbing the stairs.  She has been followed by Angela Walter since that time.  She has undergo bronchoscopies with bronchial washings that did not reveal any specific diagnosis.  Angela Walter recommended she could undergo a VATS procedure with lung biopsies for possible diagnosis.  The risks and benefits of the procedure were explained to the patient and she was agreeable to proceed.    Hospital Course:   Angela Walter presented to Angela Walter  09/17/2017.  She was taken to the operating room and underwent Video Bronchoscopy with Right VATS and lung biopsy x 4.  She tolerated the procedure without difficulty, was extubated and taken to the PACU in stable condition.  She did well post operatively.  Her CXR did not show evidence of pneumothorax.  Her chest tube did not show evidence of an air leak and was removed Walter 09/18/2017.  Follow up CXR remained stable without pneumothorax.  She had some post operative nausea which improved with anti-emetics.  Her PCA was discontinued and pain control was achieved with oral narcotics.  She is ambulating independently.  Her incisions are healing without evidence of infection.  She is medically stable for discharge home today.  Final pathology remains pending      Treatments: surgery:   VIDEO BRONCHOSCOPY (N/A) VIDEO ASSISTED THORACOSCOPY (Right) LUNG BIOPSY (Right) x 4   Discharge Exam: Blood pressure 104/75, pulse 61, temperature 98.2 F (36.8 C), temperature source Oral, resp. rate 19, height '5\' 4"'$  (1.626 m), weight 64.5 kg, SpO2 99 %.  General appearance: alert, cooperative and no distress Heart: regular rate and rhythm Lungs: clear to auscultation bilaterally Abdomen: benign Extremities: min edema Wound: incis healing well   Disposition: Discharge disposition: 01-Home or Self Care     Home  Discharge Medications:  Discharge Instructions    Discharge patient   Complete by:  As directed    Discharge disposition:  01-Home or Self Care   Discharge patient  date:  09/20/2017     Allergies as of 09/20/2017      Reactions   Ciprofloxacin Anaphylaxis      Medication List    TAKE these medications   albuterol 108 (90 Base) MCG/ACT inhaler Commonly known as:  PROVENTIL HFA;VENTOLIN HFA Inhale 2 puffs into the lungs every 6 (six) hours as needed for wheezing or shortness of breath.   traMADol 50 MG tablet Commonly known as:  ULTRAM Take 1-2 tablets (50-100 mg total) by mouth every 6  (six) hours as needed for up to 5 days (mild pain).      Follow-up Information    [facility] [place] Follow up [place] 10/06/2017.   Specialty:  Cardiothoracic Surgery Why:  Appointment is at 2:00, please get CXR at 1:30 at [place] located Walter first floow of our office building Contact information: [place], [place] 09/20/2017, 9:52 AM

## 2017-09-19 NOTE — Discharge Instructions (Signed)
Discharge Instructions:  1. You may shower, please wash incisions daily with soap and water and keep dry.  If you wish to cover wounds with dressing you may do so but please keep clean and change daily.  No tub baths or swimming until incisions have completely healed.  If your incisions become red or develop any drainage please call our office at [phone]  2. No Driving until cleared by Dr. Dennie MaizesGerhardt's office and you are no longer using narcotic pain medications  3. You may cough up some blood tinged sputum, however this should not be more than a tablespoon.  If this occurs please contact our office or go to the Emergency Room  4. Fever of 101.5 for at least 24 hours with no source, please contact our office at [phone]  5. Activity- up as tolerated, please walk at least 3 times per day.  Avoid strenuous activity for several weeks  6. If any questions or concerns arise, please do not hesitate to contact our office at [phone]

## 2017-09-19 NOTE — Progress Notes (Signed)
4mL of Fentanyl wasted into sink from PCA. Eugenia RN requested waste and was witness.   [place]  Sincerity Cedar, RN

## 2017-09-19 NOTE — Progress Notes (Signed)
[address]       [name] [address]             [phone]                 2 Days Post-Op Procedure(s) (LRB): BRONCHOSCOPY (N/A) RIGHT VIDEO ASSISTED THORACOSCOPY WITH LUNG BX X 4 (Right) LUNG BIOPSY (Right)  LOS: 2 days   Subjective: Still some nausea this morning but improving, still on PCA, chest tube out yesterday  Objective: Vital signs in last 24 hours: Patient Vitals for the past 24 hrs:  BP Temp Temp src Pulse Resp SpO2  09/19/17 0752 127/81 98.4 F (36.9 C) Oral 67 (!) 21 100 %  09/19/17 0449 - - - - 16 96 %  09/19/17 0301 107/74 98.7 F (37.1 C) Oral 88 (!) 27 96 %  09/19/17 0121 - - - - (!) 22 98 %  09/18/17 2327 113/74 99 F (37.2 C) Oral 77 (!) 24 96 %  09/18/17 2015 - - - - (!) 28 98 %  09/18/17 1924 104/71 99.3 F (37.4 C) Oral 75 (!) 24 95 %  09/18/17 1652 - - - - (!) 23 99 %  09/18/17 1545 94/60 99.1 F (37.3 C) Oral 74 (!) 30 100 %  09/18/17 1319 100/65 98.5 F (36.9 C) Oral 78 (!) 21 98 %  09/18/17 1212 - - - - (!) 23 98 %  09/18/17 0949 - - - - (!) 28 99 %    Filed Weights   09/17/17 0647  Weight: 64.5 kg    Hemodynamic parameters for last 24 hours:    Intake/Output from previous day: 09/12 0701 - 09/13 0700 In: 630 [P.O.:480; I.V.:150] Out: 233 [Urine:203; Chest Tube:30] Intake/Output this shift: No intake/output data recorded.  Scheduled Meds: . acetaminophen  1,000 mg Oral Q6H   Or  . acetaminophen (TYLENOL) oral liquid 160 mg/5 mL  1,000 mg Oral Q6H  . bisacodyl  10 mg Oral Daily  . fentaNYL   Intravenous Q4H  . insulin aspart  0-24 Units Subcutaneous Q4H  . senna-docusate  1 tablet Oral QHS   Continuous Infusions: . dextrose 5 % and 0.45% NaCl 10 mL/hr at 09/18/17 1600  . potassium chloride     PRN Meds:.albuterol, diphenhydrAMINE **OR** diphenhydrAMINE, naloxone **AND** sodium chloride flush, ondansetron (ZOFRAN) IV, oxyCODONE, potassium chloride, promethazine, traMADol  General appearance: alert  and cooperative Neurologic: intact Heart: regular rate and rhythm, S1, S2 normal, no murmur, click, rub or gallop Lungs: clear to auscultation bilaterally Abdomen: soft, non-tender; bowel sounds normal; no masses,  no organomegaly Extremities: extremities normal, atraumatic, no cyanosis or edema and Homans sign is negative, no sign of DVT  Lab Results: CBC: Recent Labs    09/18/17 0231 09/19/17 0313  WBC 8.3 6.0  HGB 11.6* 11.3*  HCT 34.4* 33.5*  PLT 244 194   BMET:  Recent Labs    09/18/17 0231 09/19/17 0313  NA 137 137  K 3.6 3.7  CL 106 107  CO2 22 25  GLUCOSE 124* 103*  BUN 6 9  CREATININE 0.76 0.73  CALCIUM 8.6* 8.7*    PT/INR: No results for input(s): LABPROT, INR in the last 72 hours.   Radiology Dg Chest 2 View  Result Date: 09/19/2017 CLINICAL DATA:  Status post chest tube removal EXAM: CHEST - 2 VIEW COMPARISON:  09/18/2017 FINDINGS: Cardiac shadow is stable. Minimal blunting of the left costophrenic angle is again noted. Some postoperative changes  are again seen in the right mid lung stable from the previous exam. No pneumothorax is noted following chest tube removal. Minimal right basilar atelectasis is noted. No bony abnormality is seen. IMPRESSION: Minimal right basilar atelectasis new from the prior exam. No pneumothorax following chest tube removal. Electronically Signed   By: Alcide CleverMark  Lukens M.D.   On: 09/19/2017 07:33   Dg Chest Port 1 View  Result Date: 09/18/2017 CLINICAL DATA:  Status post lung resection. EXAM: PORTABLE CHEST 1 VIEW COMPARISON:  09/17/2017 FINDINGS: Cardiomediastinal silhouette is normal. Mediastinal contours appear intact. Mild calcific atherosclerotic disease of the aorta. Right chest tube is in stable position. No radiographically apparent pneumothorax. Postsurgical changes in the right mid thorax. Atelectasis in the left lower lobe. Small left pleural effusion. Osseous structures are without acute abnormality. Soft tissues are grossly  normal. IMPRESSION: Stable position of the right chest tube with no radiographically appreciable pneumothorax. Postsurgical changes in the right mid lung. Small left pleural effusion and left lower lobe atelectasis. Electronically Signed   By: Ted Mcalpineobrinka  Dimitrova M.D.   On: 09/18/2017 09:51   Dg Chest Port 1 View  Result Date: 09/17/2017 CLINICAL DATA:  Status post video-assisted thoracic surgery. EXAM: PORTABLE CHEST 1 VIEW COMPARISON:  PA and lateral chest x-ray of September 15, 2017 FINDINGS: The lungs are less well inflated today. A chest tube is present in the medial aspect of the right pulmonary apex. No pneumothorax is present. There is patchy increased density in the right mid lung with surrounding suture material. The left lung is clear and well expanded. The heart and pulmonary vascularity are normal. The bony thorax is unremarkable. IMPRESSION: Increased density in the right mid lung likely reflects post biopsy changes. No postprocedure pneumothorax. The chest tube tip projects medially in the right pulmonary apex. Electronically Signed   By: David  [place] M.D.   On: 09/17/2017 11:24     Assessment/Plan: S/P Procedure(s) (LRB): BRONCHOSCOPY (N/A) RIGHT VIDEO ASSISTED THORACOSCOPY WITH LUNG BX X 4 (Right) LUNG BIOPSY (Right) Mobilize Transition from PCA pump to oral medications as tolerated, patient not ready for discharge yet we will plan home tomorrow  Delight OvensEdward B Emmanuelle Hibbitts MD 09/19/2017 8:15 AM

## 2017-09-19 NOTE — Progress Notes (Signed)
Pts' IV site irritated; short pink/purple light following vein about an inch up the arm. Painful. Couldn't give zofran through it. Removed. Pt educated on need for new IV line in case of an emergency. Pt refused. States she may allow one once her nausea decreases.   Angela  Annaleigha Woo, RN

## 2017-09-20 LAB — GLUCOSE, CAPILLARY
Glucose-Capillary: 94 mg/dL (ref 70–99)
Glucose-Capillary: 87 mg/dL (ref 70–99)

## 2017-09-20 MED ORDER — TRAMADOL HCL 50 MG PO TABS: 50.0000 mg | ORAL_TABLET | Freq: Four times a day (QID) | ORAL | 0 refills | Status: AC | PRN

## 2017-09-20 NOTE — Progress Notes (Signed)
      [address]       [place] [id number]             [phone]      3 Days Post-Op Procedure(s) (LRB): BRONCHOSCOPY (N/A) RIGHT VIDEO ASSISTED THORACOSCOPY WITH LUNG BX X 4 (Right) LUNG BIOPSY (Right) Subjective: Feels much better today, anxious to go home  Objective: Vital signs in last 24 hours: Temp:  [98.2 F (36.8 C)-98.8 F (37.1 C)] 98.2 F (36.8 C) (09/14 0802) Pulse Rate:  [61-67] 61 (09/14 0802) Cardiac Rhythm: Normal sinus rhythm (09/14 0330) Resp:  [16-27] 19 (09/14 0802) BP: (104-124)/(75-82) 104/75 (09/14 0802) SpO2:  [99 %] 99 % (09/14 0802)  Hemodynamic parameters for last 24 hours:    Intake/Output from previous day: 09/13 0701 - 09/14 0700 In: 837.4 [P.O.:720; I.V.:117.4] Out: -  Intake/Output this shift: No intake/output data recorded.  General appearance: alert, cooperative and no distress Heart: regular rate and rhythm Lungs: clear to auscultation bilaterally Abdomen: benign Extremities: min edema Wound: incis healing well  Lab Results: Recent Labs    09/18/17 0231 09/19/17 0313  WBC 8.3 6.0  HGB 11.6* 11.3*  HCT 34.4* 33.5*  PLT 244 194   BMET:  Recent Labs    09/18/17 0231 09/19/17 0313  NA 137 137  K 3.6 3.7  CL 106 107  CO2 22 25  GLUCOSE 124* 103*  BUN 6 9  CREATININE 0.76 0.73  CALCIUM 8.6* 8.7*    PT/INR: No results for input(s): LABPROT, INR in the last 72 hours. ABG    Component Value Date/Time   PHART 7.466 (H) 09/15/2017 1318   HCO3 23.1 09/15/2017 1318   ACIDBASEDEF 0.2 09/15/2017 1318   O2SAT 99.5 09/15/2017 1318   CBG (last 3)  Recent Labs    09/19/17 2322 09/20/17 0319 09/20/17 0805  GLUCAP 85 94 87    Meds Scheduled Meds: . acetaminophen  1,000 mg Oral Q6H   Or  . acetaminophen (TYLENOL) oral liquid 160 mg/5 mL  1,000 mg Oral Q6H  . bisacodyl  10 mg Oral Daily  . enoxaparin (LOVENOX) injection  30 mg Subcutaneous Daily  . fentaNYL   Intravenous Q4H  . insulin aspart   0-24 Units Subcutaneous Q4H  . senna-docusate  1 tablet Oral QHS   Continuous Infusions: . dextrose 5 % and 0.45% NaCl 10 mL/hr at 09/18/17 1600  . potassium chloride     PRN Meds:.albuterol, diphenhydrAMINE **OR** diphenhydrAMINE, naloxone **AND** sodium chloride flush, ondansetron (ZOFRAN) IV, ondansetron, oxyCODONE, potassium chloride, promethazine, traMADol  Xrays Dg Chest 2 View  Result Date: 09/19/2017 CLINICAL DATA:  Status post chest tube removal EXAM: CHEST - 2 VIEW COMPARISON:  09/18/2017 FINDINGS: Cardiac shadow is stable. Minimal blunting of the left costophrenic angle is again noted. Some postoperative changes are again seen in the right mid lung stable from the previous exam. No pneumothorax is noted following chest tube removal. Minimal right basilar atelectasis is noted. No bony abnormality is seen. IMPRESSION: Minimal right basilar atelectasis new from the prior exam. No pneumothorax following chest tube removal. Electronically Signed   By: Alcide CleverMark  Lukens M.D.   On: 09/19/2017 07:33    Assessment/Plan: S/P Procedure(s) (LRB): BRONCHOSCOPY (N/A) RIGHT VIDEO ASSISTED THORACOSCOPY WITH LUNG BX X 4 (Right) LUNG BIOPSY (Right)  1 doing well, stable for discharge   LOS: 3 days    Angela Walter 09/20/2017 Pager [phone]

## 2017-09-22 LAB — AEROBIC/ANAEROBIC CULTURE W GRAM STAIN (SURGICAL/DEEP WOUND): Culture: NO GROWTH

## 2017-09-22 NOTE — Op Note (Signed)
NAME: Angela Walter, Angela L. MEDICAL RECORD ZO:[id number] ACCOUNT [id number] DATE OF BIRTH:06-05-1970 FACILITY: [facility] LOCATION: [facility] PHYSICIAN:Racheal Mathurin Bari EdwardB. 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  SURGICAL PROCEDURE:  Bronchoscopy, right video-assisted thoracoscopy, multiple wedge resections for a biopsy of the right lung.  SURGEON:  Sheliah PlaneEdward Christia Domke, MD  FIRST ASSISTANT:  Jari Favreessa Conte, PA.  BRIEF HISTORY:  The patient is a 47 year old female with history of 6 months of increasing shortness of breath and dyspnea on exertion.  She has been fully evaluated by the pulmonary service including pulmonary function studies, CT scan and  echocardiogram.  She had changes on CT scan suggestive of small cystic areas in her lungs evenly distributed between the right and left.  She was referred by pulmonary for lung biopsy.  Risks and options of biopsy and procedure performed discussed with  the patient and her husband in detail and she was willing to proceed and signed informed consent.  DESCRIPTION OF PROCEDURE:  The patient underwent general endotracheal anesthesia with a double lumen endotracheal tube.  Appropriate timeout was performed and then we proceeded with bronchoscopy to the subsegmental level bilaterally with a bronchoscope.   There were no endobronchial lesions appreciated and the double lumen endotracheal tube was in good position.  The patient was then turned in the lateral decubitus position with the right side up.  The right side had preoperatively been marked.  The  right chest was prepped with Betadine and draped in a sterile manner.  We then collapsed the right lung and three small incisions were made.  The lowermost to approximately the sixth intercostal space mid axillary line, one more anterior and  superior and  one slightly more posterior.  Through the initial port, a 5 mm scope and introducer was placed.  This gave good visualization of the lung.  Photographs of this are in the patient's electronic medical record.  There was a small white plaque on the  surface of the lungs in the upper lobe.  This was stabilized with a small grasper through one port and through the other port gold-load staplers was used to perform a wedge resection.  The specimen was brought out, submitted and labeled left upper lobe.   We took an additional wedge of the upper lobe, an area that had fairly normal looking lung and also adjacent small reddish areas of discoloration which on closer examination after biopsy appeared to be areas of cystic breakdown.  In a similar fashion, a  second biopsy of the upper lobe was done and 2 additional wedge resections of representative areas of the lower lobe were also performed.  We then placed through the most anterior port site, a 28 Blake drain as a chest tube.  The lung was reinflated  well.  There was no air leak.  The remaining two sites were closed with interrupted 0 Vicryl and 3-0 subcuticular stitch.  Dermabond was applied.  The patient was awakened in the operating room and extubated and transferred to the recovery room in good  condition having tolerated the procedure without obvious complication.  Blood loss was minimal.  Sponge and needle count was reported as correct.  A portion of the upper lobe biopsy was separated on the operative field and submitted to pathology for  routine and fungal AFB cultures.  TN/NUANCE  D:09/22/2017 T:09/22/2017 JOB:[id number]

## 2017-09-24 ENCOUNTER — Telehealth: Payer: Self-pay | Admitting: Pulmonary Disease

## 2017-09-24 NOTE — Telephone Encounter (Signed)
Spoke with the pt  She states that the received 2 missed calls from our office today  She states no msg was left  She has appt tomorrow with Dr Isaiah SergeMannam  She thinks he is the one who tried to call her with the results  I advised that there is no documentation of a call  Please advise if you called the pt. She does not want to wait until her appt tomorrow to hear about results if you tried to call her today, thanks

## 2017-09-24 NOTE — Telephone Encounter (Signed)
Per Dr. Isaiah SergeMannam- bronch results are not back as of yet.  I have made pt aware of this. Pt has been rescheduled for 09/30/17, however she request that she be called with results if they are back prior to her OV.  Routing to Dr. Isaiah SergeMannam to make aware.

## 2017-09-25 ENCOUNTER — Ambulatory Visit: Payer: BLUE CROSS/BLUE SHIELD | Admitting: Pulmonary Disease

## 2017-09-26 NOTE — Telephone Encounter (Signed)
I called and discussed the results of the lung biopsy with Dr Kenard GowerKashikar and Dr. Tyrone SageGerhardt  Discussed with patient over the telephone today as well.  We will review in detail again at clinic visit on 9/24.  Chilton GreathousePraveen Killian Ress MD  Pulmonary and Critical Care 09/26/2017, 4:39 PM

## 2017-09-29 ENCOUNTER — Other Ambulatory Visit: Payer: Self-pay

## 2017-09-29 ENCOUNTER — Ambulatory Visit (INDEPENDENT_AMBULATORY_CARE_PROVIDER_SITE_OTHER): Payer: Self-pay

## 2017-09-29 DIAGNOSIS — Z4802 Encounter for removal of sutures: Secondary | ICD-10-CM

## 2017-09-29 NOTE — Progress Notes (Signed)
Patient arrived for nurse visit to remove sutures post- procedure.  One sutures removed with no signs/ symptoms of infection noted.  Patient tolerated procedure well.  Patient instructed to keep the incision sites clean and dry.  Patient acknowledged instructions given.   Patient also had questions about pain and when it would go away.  I advised patient that pain is different for every patient and time would tell.  Did advise of nerve pain which could last months, she acknowledged receipt.  She stated that she was trying to decrease her intake of pain medication, Tramadol as she was trying to go back to work next week.  I did advise that if she needed the pain medication to take it.

## 2017-09-30 ENCOUNTER — Ambulatory Visit: Payer: BLUE CROSS/BLUE SHIELD | Admitting: Pulmonary Disease

## 2017-09-30 ENCOUNTER — Encounter: Payer: Self-pay | Admitting: Pulmonary Disease

## 2017-09-30 ENCOUNTER — Other Ambulatory Visit: Payer: BLUE CROSS/BLUE SHIELD

## 2017-09-30 VITALS — BP 122/68 | HR 81 | Ht 63.0 in | Wt 139.0 lb

## 2017-09-30 DIAGNOSIS — J849 Interstitial pulmonary disease, unspecified: Secondary | ICD-10-CM

## 2017-09-30 NOTE — Progress Notes (Signed)
Angela Walter    [id number]    10/15/1970  Primary Care Physician:Le, Tilden Fossa, DO  Referring Physician: Glenford Bayley, DO [place], [place] [address]  Chief complaint: Follow-up for cystic lung disease  HPI: 47 year old significant past medical history.  Complains of dyspnea, fatigue since fall 2018.  Has dyspnea with activity and at rest, productive cough with chest congestion.  She is unable to get full breath.  Symptoms are worse with exercise She is physically active and has run half marathons, and a full marathon the past.  Still runs 4 times a week but is unable to get a full work-up.  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  She is also tried Symbicort intermittently for her symptoms.  She has an extensive work-up for cystic lung disease including bronchoscopy with BAL showing lymphocytes, transbronchial biopsies showed benign lung tissue Worked up for [employer] with negative genetic testing, negative VEGF levels Serologies for connective tissue disease are negative  Pets: Has dogs and cats.  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  She has down pillows and comforter She has a Jacuzzi at home but does not use it.  She had used nitrofurantoin many years ago for UTI but not recently All other exposure history questions are negative.  Interim history: Underwent surgical lung biopsy on 9/11.  She is here for review of results. States that dyspnea is stable.  She is recovering well from the procedure.  Still has some soreness at the site of surgery.  Outpatient Encounter Medications as of 09/30/2017    Medication Sig  . albuterol (PROVENTIL HFA;VENTOLIN HFA) 108 (90 Base) MCG/ACT inhaler Inhale 2 puffs into the lungs every 6 (six) hours as needed for wheezing or shortness of breath.  . diphenhydrAMINE (BENADRYL) 25 MG tablet Take 25 mg by mouth every 6 (six) hours as needed.  . traMADol (ULTRAM) 50 MG tablet Take 50 mg by mouth every 6 (six) hours as needed (can take 1-2 tablets as needed for pain.).   No facility-administered encounter medications on file as of 09/30/2017.    Physical Exam: Blood pressure 122/68, pulse 81, height _0  (1.6 m), weight 139 lb (63 kg), SpO2 98 %. Gen:      No acute distress HEENT:  EOMI, sclera anicteric Neck:     No masses; no thyromegaly Lungs:    Clear to auscultation bilaterally; normal respiratory effort CV:         Regular rate and rhythm; no murmurs Abd:      + bowel sounds; soft, non-tender; no palpable masses, no distension Ext:    No edema; adequate peripheral perfusion Skin:      Warm and dry; no rash Neuro: alert and oriented x 3 Psych: normal mood and affect  Data Reviewed: Imaging CT high-resolution 06/12/2017- numerous thin-walled cysts, mild patchy groundglass opacities in the upper lobe. CT high-resolution 09/10/2017- appearance of 10 ulcers, mild scarring in the lower lobes. I have reviewed the images personally.  PFTs PFTs 05/14/2017 FVC 3.96 [1 9%], FEV1 3.08 [107%], F/F 78, TLC 108%, DLCO 73%, DLCO/VA 67% Minimal obstruction and diffusion impairment.  FENO 05/08/2017-10  Cardiac Stress echo 04/12/2017 Stress ECG and echocardiogram was negative for ischemia  Labs CBC 05/08/2017-WBC 5.3, eos 2.8%, absolute eosinophil count 150 Blood allergy profile 05/08/2017-IgE 81, RAST panel shows mild allergies to dust mite, cat, dog, tree pollen Metabolic panel 06/11/4648- within normal limits except for K of 3.2  ABG 7.45/32/104/99%  Labs Reviewed labs from primary care 04/11/17 Double-stranded DNA, CCP antibody, ANA, uric acid-all  negative.  SPEP, HIV, ANA, CCP, rheumatoid factor, SSA, SSB, quantitative immunoglobulins 06/23/2017-all normal VEGF D 06/26/2017 at [facility]254 [reference range less than 600] Alpha-1 antitrypsin 06/26/2017-149  Path BAL 08/19/2017 Cell count WBC 117, 61% lymphs, 2% eos, 1% neutrophils, 36% monocyte macrophage Benign lung parenchyma.  Insufficient cells for cytometry  Surgical lung biopsy 09/17/2017 Lung parenchyma with emphysematous changes, rare nonnecrotizing granulomas Special stains does not support LAM, LIP  Assessment:  Cystic lung disease Dyspnea on exertion She has had an extensive work-up for her interstitial lung disease but the etiology is unclear Surgical lung biopsy findings and special stains do not support diagnosis of LAM. LIP. She is a non-smoker hence smoking-related ILD such as langerhans cell histiocytosis is unlikely.  She had genetic testing for Roda Shutters to be syndrome which is negative. Alpha 1 antitrypsin levels are negative  Findings of emphysema and rare granulomas may indicate hypersensitivity pneumonitis but CT scan is not typical and she has no significant exposures except for down pillows and comforters. We will check a hypersensitivity panel and I have asked her to get rid of her down pillows and comforters.  We will review case at multidisciplinary ILD conference  I had a long discussion with the patient today and emphasized although the etiology is unclear we had ruled out many causes of cystic lung disease.  We will continue to monitor her closely for now.  Plan/Recommendations: - Spirometry, diffusion capacity, 6-minute walk in 3 months - High-resolution CT next year  - Discuss case at multidisciplinary ILD conference  Marshell Garfinkel MD [facility] 09/30/2017, 3:16 PM  CC: Le, Thao P, DO

## 2017-09-30 NOTE — Patient Instructions (Addendum)
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  Follow-up in clinic after that.

## 2017-10-01 ENCOUNTER — Other Ambulatory Visit (INDEPENDENT_AMBULATORY_CARE_PROVIDER_SITE_OTHER): Payer: BLUE CROSS/BLUE SHIELD

## 2017-10-01 DIAGNOSIS — J849 Interstitial pulmonary disease, unspecified: Secondary | ICD-10-CM

## 2017-10-02 ENCOUNTER — Other Ambulatory Visit: Payer: Self-pay | Admitting: Cardiothoracic Surgery

## 2017-10-02 DIAGNOSIS — J849 Interstitial pulmonary disease, unspecified: Secondary | ICD-10-CM

## 2017-10-03 ENCOUNTER — Other Ambulatory Visit: Payer: Self-pay

## 2017-10-03 ENCOUNTER — Telehealth: Payer: Self-pay

## 2017-10-03 ENCOUNTER — Ambulatory Visit (HOSPITAL_COMMUNITY)
Admission: EM | Admit: 2017-10-03 | Discharge: 2017-10-03 | Disposition: A | Discharge status: Home / Self Care | Payer: BLUE CROSS/BLUE SHIELD | Attending: Family Medicine | Admitting: Family Medicine

## 2017-10-03 ENCOUNTER — Encounter (HOSPITAL_COMMUNITY): Payer: Self-pay | Admitting: Family Medicine

## 2017-10-03 DIAGNOSIS — R112 Nausea with vomiting, unspecified: Secondary | ICD-10-CM

## 2017-10-03 DIAGNOSIS — Z881 Allergy status to other antibiotic agents status: Secondary | ICD-10-CM | POA: Diagnosis not present

## 2017-10-03 DIAGNOSIS — R197 Diarrhea, unspecified: Secondary | ICD-10-CM

## 2017-10-03 DIAGNOSIS — R109 Unspecified abdominal pain: Secondary | ICD-10-CM | POA: Insufficient documentation

## 2017-10-03 DIAGNOSIS — E860 Dehydration: Secondary | ICD-10-CM | POA: Diagnosis not present

## 2017-10-03 DIAGNOSIS — Z825 Family history of asthma and other chronic lower respiratory diseases: Secondary | ICD-10-CM | POA: Insufficient documentation

## 2017-10-03 DIAGNOSIS — Z79899 Other long term (current) drug therapy: Secondary | ICD-10-CM | POA: Insufficient documentation

## 2017-10-03 DIAGNOSIS — Z8249 Family history of ischemic heart disease and other diseases of the circulatory system: Secondary | ICD-10-CM | POA: Diagnosis not present

## 2017-10-03 DIAGNOSIS — J849 Interstitial pulmonary disease, unspecified: Secondary | ICD-10-CM | POA: Diagnosis not present

## 2017-10-03 DIAGNOSIS — Z823 Family history of stroke: Secondary | ICD-10-CM | POA: Insufficient documentation

## 2017-10-03 LAB — CBC WITH DIFFERENTIAL/PLATELET
Abs Immature Granulocytes: 0 10*3/uL (ref 0.0–0.1)
Basophils Absolute: 0.1 10*3/uL (ref 0.0–0.1)
Basophils Relative: 2 %
Eosinophils Absolute: 0 10*3/uL (ref 0.0–0.7)
Eosinophils Relative: 1 %
HCT: 39.6 % (ref 36.0–46.0)
Hemoglobin: 14 g/dL (ref 12.0–15.0)
Immature Granulocytes: 0 %
Lymphocytes Relative: 17 %
Lymphs Abs: 1.1 10*3/uL (ref 0.7–4.0)
MCH: 31.3 pg (ref 26.0–34.0)
MCHC: 35.4 g/dL (ref 30.0–36.0)
MCV: 88.4 fL (ref 78.0–100.0)
Monocytes Absolute: 0.6 10*3/uL (ref 0.1–1.0)
Monocytes Relative: 10 %
Neutro Abs: 4.6 10*3/uL (ref 1.7–7.7)
Neutrophils Relative %: 70 %
Platelets: 338 10*3/uL (ref 150–400)
RBC: 4.48 MIL/uL (ref 3.87–5.11)
RDW: 12.3 % (ref 11.5–15.5)
WBC: 6.5 10*3/uL (ref 4.0–10.5)

## 2017-10-03 LAB — ACID FAST CULTURE WITH REFLEXED SENSITIVITIES (MYCOBACTERIA): Acid Fast Culture: NEGATIVE

## 2017-10-03 LAB — POCT I-STAT, CHEM 8
BUN: 15 mg/dL (ref 6–20)
Calcium, Ion: 1.21 mmol/L (ref 1.15–1.40)
Chloride: 103 mmol/L (ref 98–111)
Creatinine, Ser: 0.7 mg/dL (ref 0.44–1.00)
Glucose, Bld: 107 mg/dL — ABNORMAL HIGH (ref 70–99)
HEMATOCRIT: 41 % (ref 36.0–46.0)
HEMOGLOBIN: 13.9 g/dL (ref 12.0–15.0)
Potassium: 3.6 mmol/L (ref 3.5–5.1)
SODIUM: 139 mmol/L (ref 135–145)
TCO2: 23 mmol/L (ref 22–32)

## 2017-10-03 MED ORDER — ONDANSETRON 8 MG PO TBDP: 8.0000 mg | ORAL_TABLET | Freq: Three times a day (TID) | ORAL | 0 refills | Status: AC | PRN

## 2017-10-03 MED ORDER — ONDANSETRON HCL 4 MG/2ML IJ SOLN
4.0000 mg | Freq: Once | INTRAMUSCULAR | Status: AC
  Administered 2017-10-03: 4 mg via INTRAVENOUS

## 2017-10-03 MED ORDER — ONDANSETRON HCL 4 MG/2ML IJ SOLN
INTRAMUSCULAR | Status: AC
  Filled 2017-10-03: qty 2

## 2017-10-03 MED ORDER — SODIUM CHLORIDE 0.9 % IV SOLN
Freq: Once | INTRAVENOUS | Status: AC
  Administered 2017-10-03: 19:00:00 via INTRAVENOUS

## 2017-10-03 MED ORDER — ONDANSETRON HCL 4 MG/2ML IJ SOLN: 4.0000 mg | Freq: Once | INTRAMUSCULAR | Status: DC

## 2017-10-03 NOTE — Telephone Encounter (Signed)
Patient contacted the office because she has developed severe nausea/ vomiting/ and diarrhea which started this morning.  She was requesting medication for the nausea/ vomiting and avoid having to go to the doctor for another visit.  I advised her that she really should contact her PCP regarding these symptoms so she can be seen.  She was not sure what she was going to do as she was not fond of the response she got.  I apologized, but felt this would be the best course of action.

## 2017-10-03 NOTE — ED Triage Notes (Signed)
Pt states she has nausea and diarrhea and abdominal pain.  This started last night.

## 2017-10-03 NOTE — Discharge Instructions (Addendum)
The lab strongly suggest that this is a viral infection which means it should resolve overnight.  You will still feel tired tomorrow and should start with clear liquids in the morning before advancing to soft or solid foods later on.  Try ice chips tonight to keep your mouth moist.  If the abdominal pain worsens, you will obviously need to go to the emergency department.  I doubt this will happen however.

## 2017-10-03 NOTE — ED Provider Notes (Signed)
[facility]    CSN: [id number] Arrival date & time: 10/03/17  1638     History   Chief Complaint Chief Complaint  Patient presents with  . Diarrhea    HPI Angela Walter is a 47 y.o. female.   Pt states she has nausea and diarrhea and abdominal pain.  This started earlier this morning about 3 AM.  Patient is in the midst of a work-up for shortness of breath and abnormal chest x-ray.  She was hospitalized 2 weeks ago for a lung biopsy and chest tube.  Her last visit to the pulmonologist was 2 days ago at which time she was feeling normal with respect to her gastrointestinal tract.  Patient woke abruptly this morning at 3 AM vomiting.  Her last productive vomitus was about 430.  Subsequently she developed epigastric discomfort and diarrhea.  The most recent loose stool was in the waiting room here.       Past Medical History:  Diagnosis Date  . Abnormal Pap smear of cervix    colpo  20 years ago  . Cough   . Dyspnea   . Family history of adverse reaction to anesthesia    son has malignant hyperthermia.   . Family history of BRCA gene mutation   . Family history of breast cancer   . Hemorrhage    post partum  . Multiple idiopathic cysts of lung     Patient Active Problem List   Diagnosis Date Noted  . Lung abnormality 09/17/2017  . ILD (interstitial lung disease) ([place])   . Genetic testing 07/29/2017  . Family history of breast cancer   . Family history of BRCA gene mutation   . Multiple idiopathic cysts of lung   . Family history of BRCA gene positive 01/13/2014  . Urinary incontinence in female 01/13/2014    Past Surgical History:  Procedure Laterality Date  . adnoidectomy    . CESAREAN SECTION    . ENDOMETRIAL ABLATION    . LUNG BIOPSY Right 09/17/2017   Procedure: LUNG BIOPSY;  Surgeon: Grace Isaac, MD;  Location: [facility];  Service: Thoracic;  Laterality: Right;  . NASAL SINUS SURGERY    . TONSILLECTOMY    . tummy tuck     mini  .  URETHRAL DIVERTICULUM REPAIR    . VIDEO ASSISTED THORACOSCOPY Right 09/17/2017   Procedure: RIGHT VIDEO ASSISTED THORACOSCOPY WITH LUNG BX X 4;  Surgeon: Grace Isaac, MD;  Location: [facility];  Service: Thoracic;  Laterality: Right;  Marland Kitchen VIDEO BRONCHOSCOPY Bilateral 08/19/2017   Procedure: VIDEO BRONCHOSCOPY WITH FLUORO;  Surgeon: Marshell Garfinkel, MD;  Location: [facility];  Service: Cardiopulmonary;  Laterality: Bilateral;  . VIDEO BRONCHOSCOPY N/A 09/17/2017   Procedure: BRONCHOSCOPY;  Surgeon: Grace Isaac, MD;  Location: [facility] OR;  Service: Thoracic;  Laterality: N/A;    OB History    Gravida  2   Para  2   Term  2   Preterm      AB      Living  2     SAB      TAB      Ectopic      Multiple      Live Births               Home Medications    Prior to Admission medications   Medication Sig Start Date End Date Taking? Authorizing Provider  albuterol (PROVENTIL HFA;VENTOLIN HFA) 108 (90 Base) MCG/ACT inhaler Inhale  2 puffs into the lungs every 6 (six) hours as needed for wheezing or shortness of breath. 05/08/17   Mannam, Hart Robinsons, MD  diphenhydrAMINE (BENADRYL) 25 MG tablet Take 25 mg by mouth every 6 (six) hours as needed.    [provider]  ondansetron (ZOFRAN-ODT) 8 MG disintegrating tablet Take 1 tablet (8 mg total) by mouth every 8 (eight) hours as needed for nausea. 10/03/17   Robyn Haber, MD  traMADol (ULTRAM) 50 MG tablet Take 50 mg by mouth every 6 (six) hours as needed (can take 1-2 tablets as needed for pain.).    [provider]    Family History Family History  Problem Relation Age of Onset  . Hypertension Mother   . Hyperlipidemia Mother   . Depression Mother   . Anxiety disorder Mother   . Hyperlipidemia Father   . Hypertension Father   . Stroke Father   . COPD Father        d. 18  . Cancer Maternal Aunt 54       breast  . BRCA 1/2 Paternal Aunt        BRCA+  . Cancer Paternal Grandmother        d. 5-34 from  either stomach vs GYN cancer  . Heart disease Maternal Grandmother        d. 10  . Heart disease Paternal Grandfather        d. 2  . Malignant hyperthermia Son 2       found out during surgery as a toddler  . Healthy Son   . Healthy Son   . HIV Maternal Aunt        mat 1/2  . Breast cancer Paternal Aunt 45       BRCA pos (1/2 sister of patients mother AND her father)  . Breast cancer Other        PGF's sister  . Breast cancer Other        PGF sister    Social History Social History   Tobacco Use  . Smoking status: Never Smoker  . Smokeless tobacco: Never Used  Substance Use Topics  . Alcohol use: No    Alcohol/week: 0.0 standard drinks  . Drug use: No     Allergies   Ciprofloxacin   Review of Systems Review of Systems  Constitutional: Positive for activity change, appetite change and fatigue.  HENT: Negative.   Respiratory: Positive for shortness of breath.   Gastrointestinal: Positive for abdominal pain, diarrhea, nausea and vomiting.  Genitourinary: Negative.      Physical Exam Triage Vital Signs ED Triage Vitals  Enc Vitals Group     BP 10/03/17 1753 119/85     Pulse Rate 10/03/17 1753 68     Resp 10/03/17 1753 18     Temp 10/03/17 1753 98 F (36.7 C)     Temp Source 10/03/17 1753 Oral     SpO2 10/03/17 1753 100 %     Weight 10/03/17 1756 135 lb (61.2 kg)     Height --      Head Circumference --      Peak Flow --      Pain Score --      Pain Loc --      Pain Edu? --      Excl. in GC? --    Orthostatic VS for the past 24 hrs:  BP- Lying Pulse- Lying BP- Sitting Pulse- Sitting BP- Standing at 0 minutes Pulse- Standing at 0  minutes  10/03/17 1835 117/82 83 112/82 90 118/84 100    Updated Vital Signs BP 119/85 (BP Location: Right Arm)   Pulse 68   Temp 98 F (36.7 C) (Oral)   Resp 18   Wt 61.2 kg   SpO2 100%   BMI 23.91 kg/m    Physical Exam  Constitutional: She is oriented to person, place, and time. She appears well-developed and  well-nourished.  HENT:  Head: Normocephalic.  Right Ear: External ear normal.  Left Ear: External ear normal.  Mouth is mildly dried  Eyes: Pupils are equal, round, and reactive to light. Conjunctivae and EOM are normal.  Neck: Normal range of motion. Neck supple.  Cardiovascular: Normal rate, regular rhythm and normal heart sounds.  Pulmonary/Chest: Effort normal and breath sounds normal.  Abdominal: Soft.  Diminished but present bowel sounds Right upper quadrant tenderness Epigastric tenderness with deep palpation  Musculoskeletal: Normal range of motion.  Neurological: She is alert and oriented to person, place, and time.  Skin: Skin is warm and dry.  Nursing note and vitals reviewed.  Patient improved dramatically with respect to symptoms and color after the 1 L of normal saline was infused with 4 mg of Zofran IV.  UC Treatments / Results  Labs (all labs ordered are listed, but only abnormal results are displayed) Labs Reviewed  POCT I-STAT, CHEM 8 - Abnormal; Notable for the following components:      Result Value   Glucose, Bld 107 (*)    All other components within normal limits  CBC WITH DIFFERENTIAL/PLATELET    EKG None  Radiology No results found.  Procedures Procedures (including critical care time)  Medications Ordered in UC Medications  0.9 %  sodium chloride infusion ( Intravenous New Bag/Given 10/03/17 1844)  ondansetron (ZOFRAN) injection 4 mg (4 mg Intravenous Given 10/03/17 1843)    Initial Impression / Assessment and Plan / UC Course  I have reviewed the triage vital signs and the nursing notes.  Pertinent labs & imaging results that were available during my care of the patient were reviewed by me and considered in my medical decision making (see chart for details).    Final Clinical Impressions(s) / UC Diagnoses   Final diagnoses:  Nausea vomiting and diarrhea  Dehydration     Discharge Instructions     The lab strongly suggest that  this is a viral infection which means it should resolve overnight.  You will still feel tired tomorrow and should start with clear liquids in the morning before advancing to soft or solid foods later on.  Try ice chips tonight to keep your mouth moist.  If the abdominal pain worsens, you will obviously need to go to the emergency department.  I doubt this will happen however.    ED Prescriptions    Medication Sig Dispense Auth. Provider   ondansetron (ZOFRAN-ODT) 8 MG disintegrating tablet Take 1 tablet (8 mg total) by mouth every 8 (eight) hours as needed for nausea. 12 tablet Robyn Haber, MD     Controlled Substance Prescriptions [place] Controlled Substance Registry consulted? Not Applicable   Robyn Haber, MD 10/03/17 [phone]

## 2017-10-06 ENCOUNTER — Ambulatory Visit: Payer: BLUE CROSS/BLUE SHIELD

## 2017-10-06 ENCOUNTER — Ambulatory Visit (INDEPENDENT_AMBULATORY_CARE_PROVIDER_SITE_OTHER): Payer: Self-pay | Admitting: Cardiothoracic Surgery

## 2017-10-06 ENCOUNTER — Ambulatory Visit
Admission: RE | Admit: 2017-10-06 | Discharge: 2017-10-06 | Disposition: A | Discharge status: Home / Self Care | Payer: BLUE CROSS/BLUE SHIELD | Source: Ambulatory Visit | Attending: Cardiothoracic Surgery | Admitting: Cardiothoracic Surgery

## 2017-10-06 VITALS — BP 104/76 | HR 90 | Resp 20 | Ht 64.0 in | Wt 137.0 lb

## 2017-10-06 DIAGNOSIS — J849 Interstitial pulmonary disease, unspecified: Secondary | ICD-10-CM

## 2017-10-06 LAB — HYPERSENSITIVITY PNEUMONITIS
A. Pullulans Abs: NEGATIVE
A.Fumigatus #1 Abs: NEGATIVE
Micropolyspora faeni, IgG: NEGATIVE
PIGEON SERUM ABS: NEGATIVE
THERMOACT. SACCHARII: NEGATIVE
Thermoactinomyces vulgaris, IgG: NEGATIVE

## 2017-10-06 NOTE — Progress Notes (Signed)
[address]       [place] [address]             [phone]                  Angela Walter [facility] Medical Record #[id number] Date of Birth: 07/27/1970  Referring OZ:HYQMVH, Colbert Coyer, MD Primary Cardiology: Primary Care:Le, Rachelle Hora, DO  Chief Complaint:  Follow Up Visit   History of Present Illness:     Patient returns today in follow-up after her recent right video-assisted thoracoscopy lung biopsy.  Final path is noted and has been discussed with the patient.  She is made good progress postoperatively.  She is returned to work and driving without difficulty.  She has not started exercising as she did before she noted pulmonary difficulty.      ubrod Score: At the time of surgery this patient's most appropriate activity status/level should be described as: []     0    Normal activity, no symptoms [x]     1    Restricted in physical strenuous activity but ambulatory, able to do out light work []     2    Ambulatory and capable of self care, unable to do work activities, up and about                 >50 % of waking hours                                                                                   []     3    Only limited self care, in bed greater than 50% of waking hours []     4    Completely disabled, no self care, confined to bed or chair []     5    Moribund  Social History   Tobacco Use  Smoking Status Never Smoker  Smokeless Tobacco Never Used       Allergies  Allergen Reactions  . Ciprofloxacin Anaphylaxis    Current Outpatient Medications  Medication Sig Dispense Refill  . albuterol (PROVENTIL HFA;VENTOLIN HFA) 108 (90 Base) MCG/ACT inhaler Inhale 2 puffs into the lungs every 6 (six) hours as needed for wheezing or shortness of breath. (Patient not taking: Reported on 10/06/2017) 1 Inhaler 6  . diphenhydrAMINE (BENADRYL) 25 MG tablet Take 25 mg by mouth every 6 (six) hours as needed.    . ondansetron (ZOFRAN-ODT) 8 MG  disintegrating tablet Take 1 tablet (8 mg total) by mouth every 8 (eight) hours as needed for nausea. (Patient not taking: Reported on 10/06/2017) 12 tablet 0  . traMADol (ULTRAM) 50 MG tablet Take 50 mg by mouth every 6 (six) hours as needed (can take 1-2 tablets as needed for pain.).     No current facility-administered medications for this visit.        Physical Exam: BP 104/76   Pulse 90   Resp 20   Ht 5\' 4"  (1.626 m)   Wt 137 lb (62.1 kg)   SpO2 93% Comment: ra  BMI 23.52 kg/m   General appearance: alert, cooperative and no distress Neurologic: intact Heart: regular rate and  rhythm, S1, S2 normal, no murmur, click, rub or gallop Lungs: clear to auscultation bilaterally Abdomen: soft, non-tender; bowel sounds normal; no masses,  no organomegaly Extremities: extremities normal, atraumatic, no cyanosis or edema and Homans sign is negative, no sign of DVT Wound: Port sites are all healing well   Diagnostic Studies & Laboratory data:         Recent Radiology Findings: Dg Chest 2 View  Result Date: 10/06/2017 CLINICAL DATA:  Short of breath.  History of lung biopsy. EXAM: CHEST - 2 VIEW COMPARISON:  09/19/2017 and older studies. FINDINGS: Cardiac silhouette is normal in size and configuration. Normal mediastinal and hilar contours. Pulmonary anastomosis staples are noted in the right mid and lower lung, unchanged. No evidence of pneumonia or pulmonary edema. No pleural effusion or pneumothorax. Skeletal structures are intact. IMPRESSION: No active cardiopulmonary disease. Electronically Signed   By: Amie Portland M.D.   On: 10/06/2017 15:10      I have independently reviewed the above radiology findings and reviewed findings  with the patient.  Recent Labs: Lab Results  Component Value Date   WBC 6.5 10/03/2017   HGB 13.9 10/03/2017   HCT 41.0 10/03/2017   PLT 338 10/03/2017   GLUCOSE 107 (H) 10/03/2017   ALT 28 09/19/2017   AST 29 09/19/2017   NA 139 10/03/2017   K 3.6  10/03/2017   CL 103 10/03/2017   CREATININE 0.70 10/03/2017   BUN 15 10/03/2017   CO2 25 09/19/2017   INR 1.02 09/15/2017   Diagnosis 1. Lung, biopsy, Right Upper Lobe Wedge - LUNG PARENCHYMA WITH EMPHYSEMATOUS CHANGES. SEE NOTE - RARE NON-NECROTIZING EPITHELIOID GRANULOMAS - NO EVIDENCE OF ANY SIGNIFICANT INFLAMMATION OR FIBROSIS 2. Lung, biopsy, Right Upper Lobe Wedge - LUNG PARENCHYMA WITH EMPHYSEMATOUS CHANGES. SEE NOTE - NO EVIDENCE OF ANY SIGNIFICANT INFLAMMATION OR FIBROSIS 3. Lung, biopsy, Right Lower Lobe - LUNG PARENCHYMA WITH EMPHYSEMATOUS CHANGES. SEE NOTE - NO EVIDENCE OF ANY SIGNIFICANT INFLAMMATION OR FIBROSIS 4. Lung, biopsy, Upper portion of Right Lobe - LUNG PARENCHYMA WITH EMPHYSEMATOUS CHANGES. SEE NOTE - RARE INDIVIDUAL GIANT CELLS - NO EVIDENCE OF ANY SIGNIFICANT INFLAMMATION OR FIBROSIS   Assessment / Plan:      Patient is recovering from her right lung biopsy of upper lobe and and lower lobe.  No definitive diagnosis was made from the biopsy but numerous diagnoses were ruled out.  Testing for hypersensitivity pneumonitis continues.  I am not made the patient a follow-up appointment in the surgical office but would be glad to see her as necessary.    Delight Ovens 10/06/2017 5:21 PM

## 2017-10-15 LAB — FUNGUS CULTURE WITH STAIN

## 2017-10-15 LAB — FUNGUS CULTURE RESULT

## 2017-10-15 LAB — FUNGAL ORGANISM REFLEX

## 2017-10-30 LAB — ACID FAST CULTURE WITH REFLEXED SENSITIVITIES (MYCOBACTERIA): Acid Fast Culture: NEGATIVE

## 2017-11-03 NOTE — Telephone Encounter (Signed)
-----   Message -----  From: Encarnacion Chu  Sent: 10/28/20193:07 PM EDT  To: Chilton Greathouse, MD Subject: Non-Urgent Medical Question  Good afternoon, during my last appointment you mentioned that you would be meeting with a group of pulmonologists during a monthly meeting and would be presenting my case.Any insight from this meeting?  Boneta Lucks    Dr. Isaiah Serge, please advise. Thanks!

## 2017-11-05 NOTE — Telephone Encounter (Signed)
Hi,  The meeting for this month got cancelled for scheduling reasons so we have you on the list for discussion for next month which will be on Nov 12th. I apologize for the delay and will give you a call in person after this meeting to update you.  Thanks Dr. Isaiah Serge

## 2017-11-10 ENCOUNTER — Ambulatory Visit (INDEPENDENT_AMBULATORY_CARE_PROVIDER_SITE_OTHER): Payer: BLUE CROSS/BLUE SHIELD | Admitting: Pulmonary Disease

## 2017-11-10 DIAGNOSIS — J849 Interstitial pulmonary disease, unspecified: Secondary | ICD-10-CM

## 2017-11-10 LAB — PULMONARY FUNCTION TEST
DL/VA % pred: 88 %
DL/VA: 4.09 ml/min/mmHg/L
DLCO COR % PRED: 96 %
DLCO COR: 21.37 ml/min/mmHg
DLCO UNC: 21.75 ml/min/mmHg
DLCO unc % pred: 97 %
FEF 25-75 Pre: 2.33 L/sec
FEF2575-%PRED-PRE: 83 %
FEV1-%Pred-Pre: 106 %
FEV1-Pre: 2.9 L
FEV1FVC-%Pred-Pre: 95 %
FEV6-%PRED-PRE: 112 %
FEV6-Pre: 3.74 L
FEV6FVC-%Pred-Pre: 102 %
FVC-%PRED-PRE: 109 %
FVC-Pre: 3.74 L
PRE FEV1/FVC RATIO: 77 %
PRE FEV6/FVC RATIO: 100 %

## 2017-11-10 NOTE — Progress Notes (Signed)
PFT completed today.  

## 2017-11-13 ENCOUNTER — Other Ambulatory Visit: Payer: Self-pay | Admitting: Family Medicine

## 2017-11-13 DIAGNOSIS — Z1231 Encounter for screening mammogram for malignant neoplasm of breast: Secondary | ICD-10-CM

## 2017-11-14 ENCOUNTER — Telehealth: Payer: Self-pay | Admitting: Pulmonary Disease

## 2017-11-14 NOTE — Telephone Encounter (Signed)
Left detailed message requesting that pt arrive prior to OV time on 11/17/17 to complete ILD questionnaire.

## 2017-11-17 ENCOUNTER — Ambulatory Visit: Payer: BLUE CROSS/BLUE SHIELD | Admitting: Pulmonary Disease

## 2017-11-17 ENCOUNTER — Encounter: Payer: Self-pay | Admitting: Pulmonary Disease

## 2017-11-17 ENCOUNTER — Encounter: Payer: Self-pay | Admitting: Licensed Clinical Social Worker

## 2017-11-17 ENCOUNTER — Ambulatory Visit (INDEPENDENT_AMBULATORY_CARE_PROVIDER_SITE_OTHER): Payer: BLUE CROSS/BLUE SHIELD | Admitting: *Deleted

## 2017-11-17 ENCOUNTER — Other Ambulatory Visit: Payer: BLUE CROSS/BLUE SHIELD

## 2017-11-17 VITALS — BP 112/70 | HR 78 | Ht 62.5 in | Wt 142.8 lb

## 2017-11-17 DIAGNOSIS — J849 Interstitial pulmonary disease, unspecified: Secondary | ICD-10-CM

## 2017-11-17 DIAGNOSIS — Z23 Encounter for immunization: Secondary | ICD-10-CM | POA: Diagnosis not present

## 2017-11-17 NOTE — Addendum Note (Signed)
Addended by: Maurene Capes on: 11/17/2017 11:22 AM   Modules accepted: Level of Service

## 2017-11-17 NOTE — Progress Notes (Signed)
Angela Walter    [id number]    Dec 09, 1970  Primary Care Physician:Le, Tilden Fossa, DO  Referring Physician: Glenford Bayley, DO [place], [place] [address]  Chief complaint: Follow-up for cystic lung disease  HPI: 47 year old significant past medical history.  Complains of dyspnea, fatigue since fall 2018.  Has dyspnea with activity and at rest, productive cough with chest congestion.  She is unable to get full breath.  Symptoms are worse with exercise She is physically active and has run half marathons, and a full marathon the past.  Still runs 4 times a week but is unable to get a full work-up.  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  She is also tried Symbicort intermittently for her symptoms.  She has an extensive work-up for cystic lung disease including bronchoscopy with BAL showing lymphocytes, transbronchial biopsies showed benign lung tissue Worked up for [employer] with negative genetic testing, negative VEGF levels Serologies for connective tissue disease are negative Underwent surgical lung biopsy on 9/11.  Pets: Has dogs and cats.  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  She has down pillows and comforter She has a Jacuzzi at home but does not use it.  She had used nitrofurantoin many years ago for UTI but not recently All other exposure history questions are negative.  Interim history: Dyspnea is improved.  She is able to do a 5 mile run without any issue Notes that her calcium level was elevated and wonders if this may be related to her lung  Outpatient Encounter Medications as of 11/17/2017    Medication Sig  . albuterol (PROVENTIL HFA;VENTOLIN HFA) 108 (90 Base) MCG/ACT inhaler Inhale 2 puffs into the lungs every 6 (six) hours as needed for wheezing or shortness of breath.  . diphenhydrAMINE (BENADRYL) 25 MG tablet Take 25 mg by mouth every 6 (six) hours as needed.  . ondansetron (ZOFRAN-ODT) 8 MG disintegrating tablet Take 1 tablet (8 mg total) by mouth every 8 (eight) hours as needed for nausea.  . traMADol (ULTRAM) 50 MG tablet Take 50 mg by mouth every 6 (six) hours as needed (can take 1-2 tablets as needed for pain.).   No facility-administered encounter medications on file as of 11/17/2017.    Physical Exam: Blood pressure 122/68, pulse 81, height _0  (1.6 m), weight 139 lb (63 kg), SpO2 98 %. Gen:      No acute distress HEENT:  EOMI, sclera anicteric Neck:     No masses; no thyromegaly Lungs:    Clear to auscultation bilaterally; normal respiratory effort CV:         Regular rate and rhythm; no murmurs Abd:      + bowel sounds; soft, non-tender; no palpable masses, no distension Ext:    No edema; adequate peripheral perfusion Skin:      Warm and dry; no rash Neuro: alert and oriented x 3 Psych: normal mood and affect  Data Reviewed: Imaging CT high-resolution 06/12/2017- numerous thin-walled cysts, mild patchy groundglass opacities in the upper lobe. CT high-resolution 09/10/2017- appearance of 10 ulcers, mild scarring in the lower lobes. I have reviewed the  images personally.  PFTs PFTs 05/14/2017 FVC 3.96 [1 9%], FEV1 3.08 [107%], F/F 78, TLC 108%, DLCO 73%, DLCO/VA 67% Minimal obstruction and diffusion impairment.  PFTs 09/10/2017 FVC 3.74 [109%), FEV1 2.9 [106%), F/F 77, DLCO 97% Improvement in diffusion capacity compared to prior  FENO 05/08/2017-10  Cardiac Stress echo 04/12/2017 Stress ECG and echocardiogram was negative for ischemia  Labs CBC 05/08/2017-WBC 5.3, eos 2.8%, absolute eosinophil count 150 Blood allergy profile 05/08/2017-IgE 81, RAST panel shows  mild allergies to dust mite, cat, dog, tree pollen Metabolic panel 02/12/7122- within normal limits except for K of 3.2  ABG 7.45/32/104/99%  Labs Reviewed labs from primary care 04/11/17 Double-stranded DNA, CCP antibody, ANA, uric acid-all negative.  SPEP, HIV, ANA, CCP, rheumatoid factor, SSA, SSB, quantitative immunoglobulins 06/23/2017-all normal VEGF D 06/26/2017 at [facility].-254 [reference range less than 600] Alpha-1 antitrypsin 06/26/2017-149  Path BAL 08/19/2017 Cell count WBC 117, 61% lymphs, 2% eos, 1% neutrophils, 36% monocyte macrophage Benign lung parenchyma.  Insufficient cells for cytometry  Surgical lung biopsy 09/17/2017 Lung parenchyma with emphysematous changes, rare nonnecrotizing granulomas Special stains does not support LAM, LIP  Assessment:  Cystic lung disease Dyspnea on exertion She has had an extensive work-up for her interstitial lung disease but the etiology is unclear Surgical lung biopsy findings and special stains do not support diagnosis of LAM. LIP. She is a non-smoker hence smoking-related ILD such as langerhans cell histiocytosis is unlikely.  She had genetic testing for Roda Shutters to be syndrome which is negative. Alpha 1 antitrypsin levels are negative  Findings of emphysema and rare granulomas may indicate hypersensitivity pneumonitis but CT scan is not typical and she has no significant exposures except for down pillows and comforters. We will check a hypersensitivity panel and I have asked her to get rid of her down pillows and comforters.  We will review case at multidisciplinary ILD conference  I had a long discussion with the patient today and emphasized although the etiology is unclear we had ruled out many causes of cystic lung disease.  We will continue to monitor her closely for now.  Check angiotensin-converting enzyme given high calcium.  Low suspicion for sarcoidosis as CT scan is not typical but we will evaluate  further.  Health maintenance Flu vaccine today.  Plan/Recommendations: - Angiotensin converting enzyme - Discuss case at multidisciplinary ILD conference  Marshell Garfinkel MD [facility] 11/17/2017, 11:00 AM  CC: Le, Thao P, DO

## 2017-11-17 NOTE — Patient Instructions (Signed)
We will check a blood test called angiotensin-converting enzyme to see if the high calcium could be from sarcoidosis I am going to review the pathology tomorrow in conference and will give you a call after Give you a flu shot today  Follow-up in 6 months.

## 2017-11-17 NOTE — Progress Notes (Signed)
SIX MIN WALK 11/17/2017  Medications None  Supplimental Oxygen during Test? (L/min) No  Laps 10  Partial Lap (in Meters) 24  Baseline BP (sitting) 110/68  Baseline Heartrate 86  Baseline Dyspnea (Borg Scale) 1  Baseline Fatigue (Borg Scale) 2  Baseline SPO2 99  BP (sitting) 112/74  Heartrate 102  Dyspnea (Borg Scale) 2  Fatigue (Borg Scale) 2  SPO2 99  BP (sitting) 112/72  Heartrate 91  SPO2 98  Stopped or Paused before Six Minutes No  Distance Completed 504  Tech Comments: Patient was able to complete her test without stopping or the need for O2. She denied any SOB, chest or leg pain before, during and after walk. She was able to complete test at a moderate walking pace.

## 2017-11-17 NOTE — Progress Notes (Signed)
UPDATE: MSH3 [id number] (p.[id number]) VUS has been reclassified to "Likely Benign." The report date is 11/11/2017.  

## 2017-11-20 LAB — ANGIOTENSIN CONVERTING ENZYME: Angiotensin-Converting Enzyme: 38 U/L (ref 9–67)

## 2017-11-25 ENCOUNTER — Ambulatory Visit: Payer: BLUE CROSS/BLUE SHIELD

## 2017-11-25 ENCOUNTER — Ambulatory Visit: Payer: BLUE CROSS/BLUE SHIELD | Admitting: Pulmonary Disease

## 2017-11-26 ENCOUNTER — Other Ambulatory Visit (HOSPITAL_COMMUNITY)
Admission: RE | Admit: 2017-11-26 | Discharge: 2017-11-26 | Disposition: A | Discharge status: Home / Self Care | Payer: BLUE CROSS/BLUE SHIELD | Source: Ambulatory Visit | Attending: Anatomic Pathology | Admitting: Anatomic Pathology

## 2017-11-26 DIAGNOSIS — J849 Interstitial pulmonary disease, unspecified: Secondary | ICD-10-CM | POA: Insufficient documentation

## 2017-11-27 ENCOUNTER — Ambulatory Visit (INDEPENDENT_AMBULATORY_CARE_PROVIDER_SITE_OTHER): Payer: BLUE CROSS/BLUE SHIELD

## 2017-11-27 DIAGNOSIS — Z1231 Encounter for screening mammogram for malignant neoplasm of breast: Secondary | ICD-10-CM | POA: Diagnosis not present

## 2017-12-11 ENCOUNTER — Telehealth: Payer: Self-pay | Admitting: Pulmonary Disease

## 2017-12-11 NOTE — Telephone Encounter (Signed)
Called and spoke to Dr. Trula Orehristina, call transferred to Dr. Isaiah SergeMannam. Nothing further is needed at this time.

## 2017-12-17 ENCOUNTER — Encounter (HOSPITAL_COMMUNITY): Payer: Self-pay

## 2017-12-19 NOTE — Telephone Encounter (Signed)
I called and spoke with Dr. Josephine Ig[facility]ristina [facility].  [facility] a [facility] Reviewed case in detail and sent [facility] and [place] [facility]  I also called the patient to update her on the report from [place] and left a voice message requesting call back.  Chilton GreathousePraveen Larene Ascencio MD [place] 12/19/2017, 3:26 PM

## 2017-12-22 ENCOUNTER — Telehealth: Payer: Self-pay | Admitting: Pulmonary Disease

## 2017-12-22 NOTE — Telephone Encounter (Signed)
I called and discussed with patient.  Reviewed biopsy results from [place] and my conversation with Dr. Vick FreesBarakuskas from [facility].  We will await the evaluation from [facility].

## 2017-12-22 NOTE — Telephone Encounter (Signed)
Called patient unable to [facility] Dr. Isaiah SergeMannam patient is requesting a call back from your phone call.   December 19, 2017  Chilton GreathouseMannam, Praveen, MD       3:26 PM  Note    I called and spoke with Dr. Josephine Igohristina [facility]as from [facility].  [facility] a [facility] Reviewed case in detail and sent [facility] and [facility] [facility]  I also called the patient to update her on the report from [place] and left a voice message requesting call back.  Chilton GreathousePraveen Mannam MD [facility] 12/19/2017, 3:26 PM

## 2018-01-23 ENCOUNTER — Encounter: Payer: Self-pay | Admitting: Pulmonary Disease

## 2018-01-23 NOTE — Progress Notes (Addendum)
   Interstitial Lung Disease Multidisciplinary Conference   Angela Walter    MRN [id number]    DOB 1970/05/28  Primary Care Physician:Le, Rachelle Hora, DO  Referring Physician: Chilton Greathouse MD  Time of Conference: 7.30am- 8.30am Date of conference: 01/20/2018  Time of Conference: 7.30am- 8.30am Date of conference: 01/20/2018 Location of Conference: - [facility], [address]  Participating Pulmonary: Dr. Kalman Shan, MD ,  Dr Chilton Greathouse, MD, Dr. Heber [place] MD, Dr. 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  Work-up including serologies, VEGF levels and biopsy is unrevealing  Serology:  SPEP, HIV, ANA, CCP, rheumatoid factor, SSA, SSB, quantitative immunoglobulins 06/23/2017-all normal VEGF D 06/26/2017 at [facility]254 [reference range less than 600] Alpha-1 antitrypsin 06/26/2017-149  MDD discussion of CT scan  -   Diffuse thin-walled cysts bilaterally.  Initial presence of groundglass opacities which is resolved on subsequent CT scan.  Pathology discussion of biopsy : Local path reviewed.  Also reviewed was [place] opinion from [facility] of emphyse[place], dilated respiratory bronchioles.  Very minimal findings of inflammation, granulomas which was not significant.  Staining for LAM is negative.  Histologic features of LAP not present.  PFTs:  Unremarkable  MDD Impression/Recs:  Predominant findings appear to be emphysema, dilated respiratory bronchioles.  Inflammatory findings are not impressive.  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 No clear evidence of LAP, LAM or other interstitial process. Continue monitoring.  Patient is getting [facility] opinion at [facility].  Will await their evaluation.  SIGNATURE   Chilton Greathouse MD [facility] 01/23/2018, 9:18 AM  9:05 AM 01/23/2018

## 2018-03-11 NOTE — Telephone Encounter (Signed)
Dr. Mannam - please advise. Thanks. 

## 2018-03-15 NOTE — Telephone Encounter (Signed)
Yes. It is ok to write a letter stating that the test was necessary and was done as work up for interstitial lung disease of unclear etiology

## 2018-03-16 NOTE — Telephone Encounter (Signed)
Letter has been typed up for pt. Waiting for a response from pt if she wants me to place the letter up front for her to come pick up.

## 2018-03-16 NOTE — Telephone Encounter (Signed)
Pt responded back to [employer] and was needing letter mailed to her. Letter has been placed in mail for pt. Nothing further needed.

## 2019-03-17 ENCOUNTER — Other Ambulatory Visit: Payer: Self-pay | Admitting: Family Medicine

## 2019-03-17 DIAGNOSIS — Z1231 Encounter for screening mammogram for malignant neoplasm of breast: Secondary | ICD-10-CM

## 2019-03-25 ENCOUNTER — Other Ambulatory Visit: Payer: Self-pay | Admitting: Physician Assistant

## 2019-03-25 ENCOUNTER — Ambulatory Visit (INDEPENDENT_AMBULATORY_CARE_PROVIDER_SITE_OTHER): Payer: BC Managed Care – PPO

## 2019-03-25 ENCOUNTER — Other Ambulatory Visit: Payer: Self-pay

## 2019-03-25 DIAGNOSIS — Z1231 Encounter for screening mammogram for malignant neoplasm of breast: Secondary | ICD-10-CM

## 2020-03-03 IMAGING — CT CT CHEST HIGH RESOLUTION W/O CM
2 of 5 series · 15 of 36 positions shown, 18 images · non-contrast
Comparison: 04/03/2017 chest radiograph.

CLINICAL DATA: Chronic dyspnea.  Asthma.  Nonsmoker.

EXAM:
CT CHEST WITHOUT CONTRAST
TECHNIQUE: Multidetector CT imaging of the chest was performed following the
standard protocol without intravenous contrast. High resolution
imaging of the lungs, as well as inspiratory and expiratory imaging,
was performed.

[Series 4: thorax · axial · 0.64mm/px · z∈[-312,-28]mm · 12 of 158 slices shown, 15 images]
[im 8/158  mediastinal]
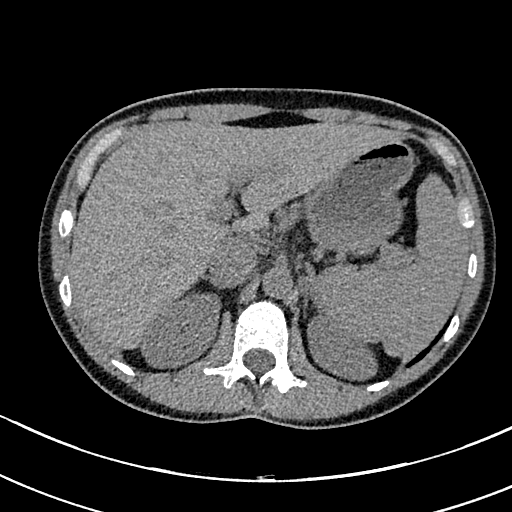
[im 8/158  lung]
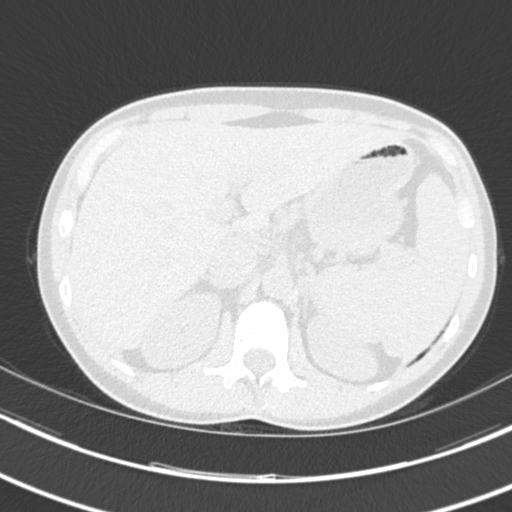
[im 23/158  lung]
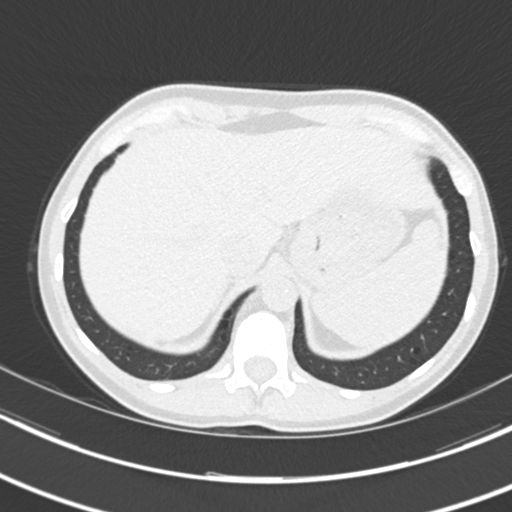
[im 38/158  lung]
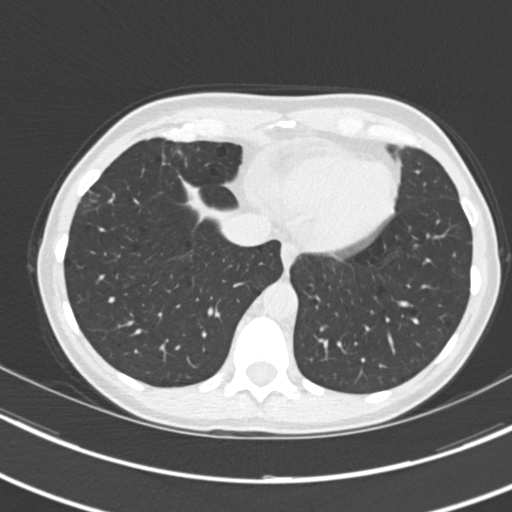
[im 45/158  lung]
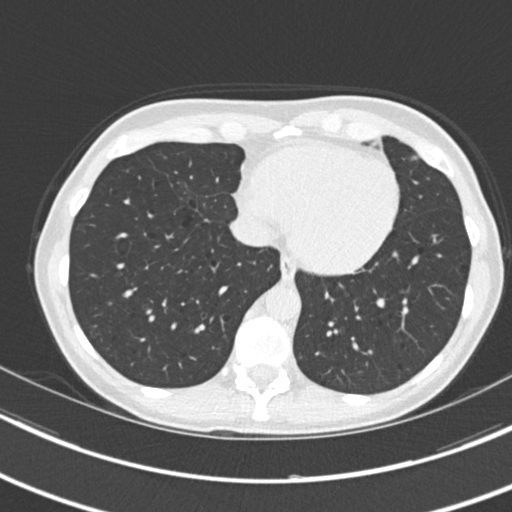
[im 60/158  mediastinal]
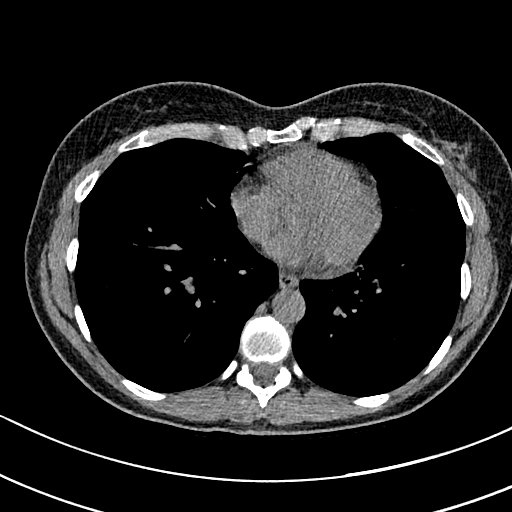
[im 60/158  lung]
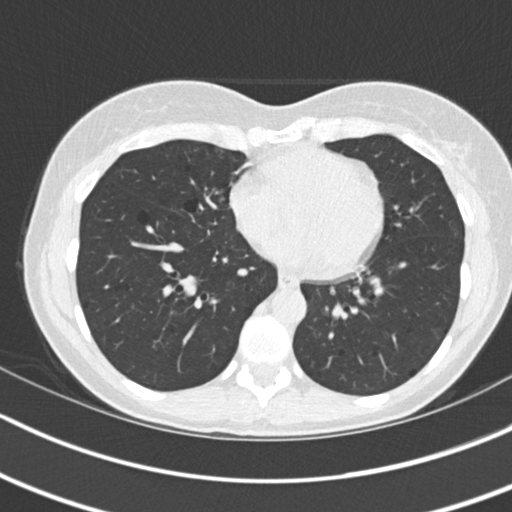
[im 75/158  lung]
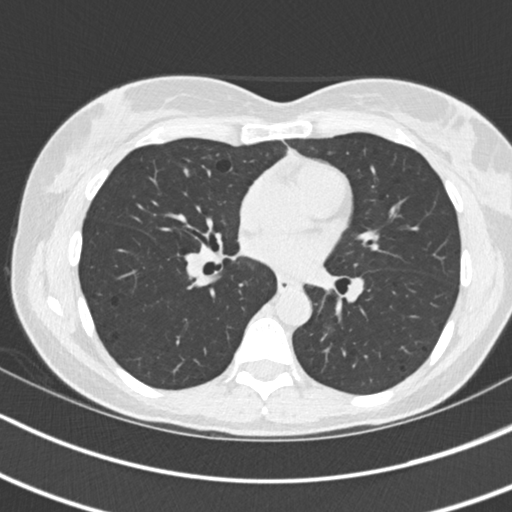
[im 83/158  lung]
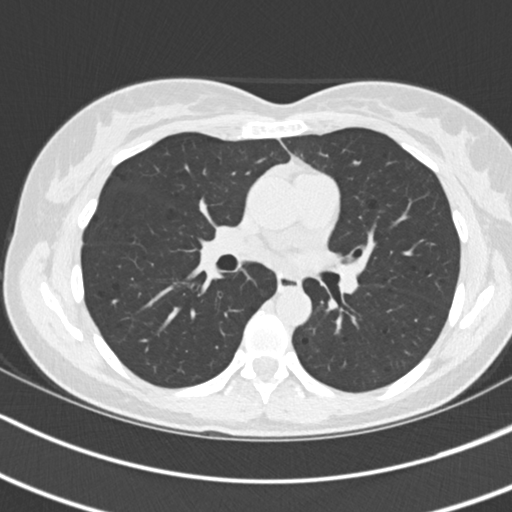
[im 98/158  lung]
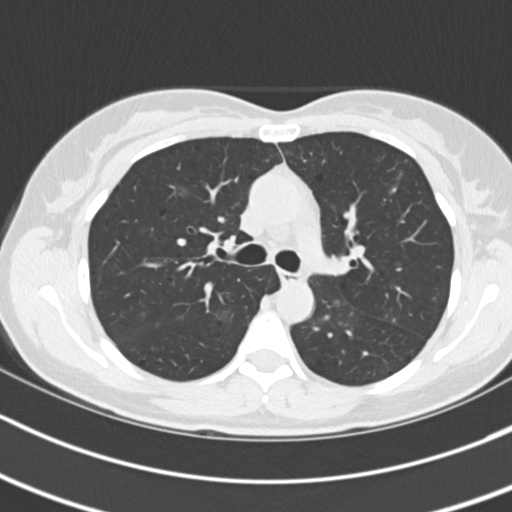
[im 113/158  mediastinal]
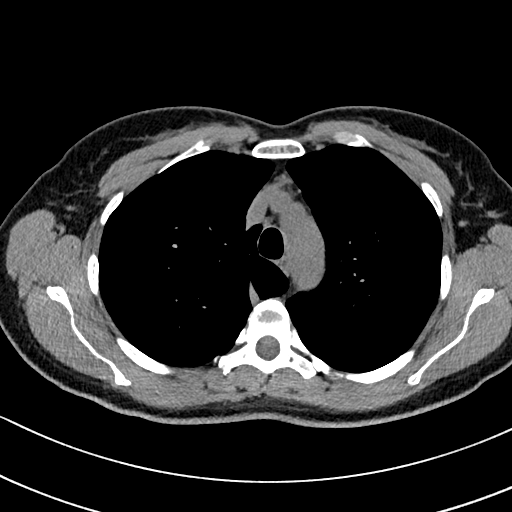
[im 113/158  lung]
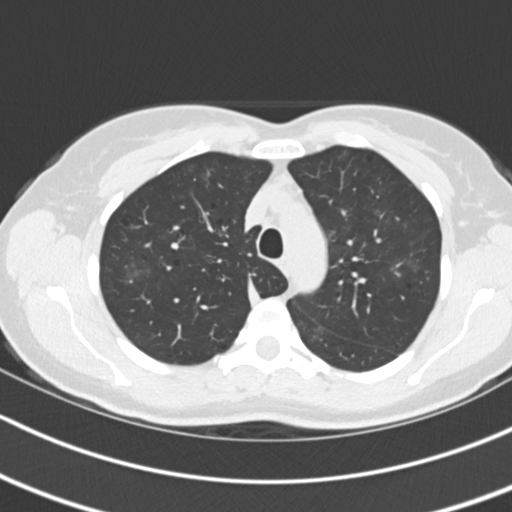
[im 120/158  lung]
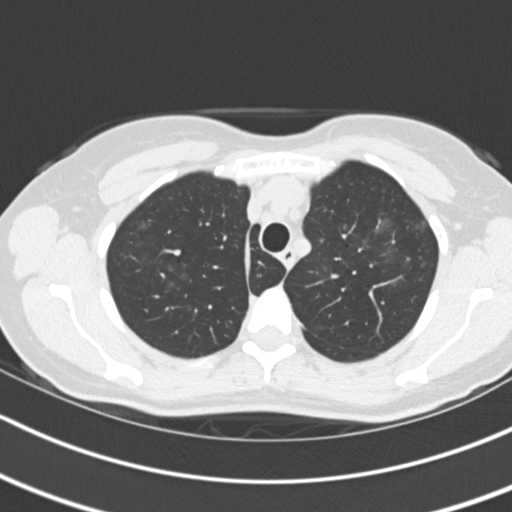
[im 135/158  lung]
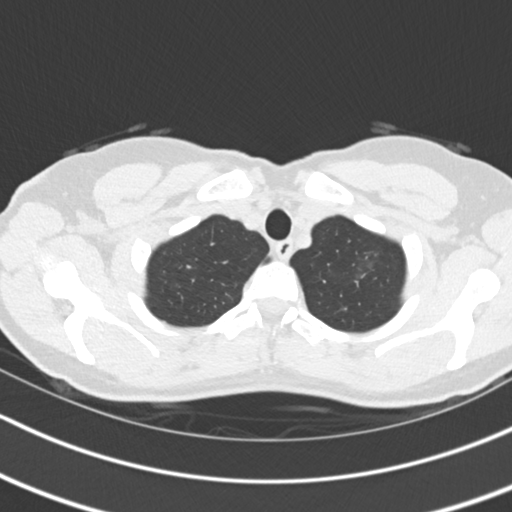
[im 150/158  lung]
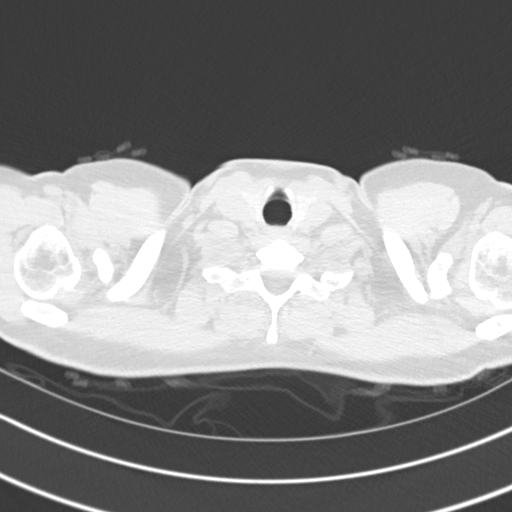

[Series 8: coronal · coronal · 0.62mm/px · 3 of 114 slices shown]
[im 23/114  lung]
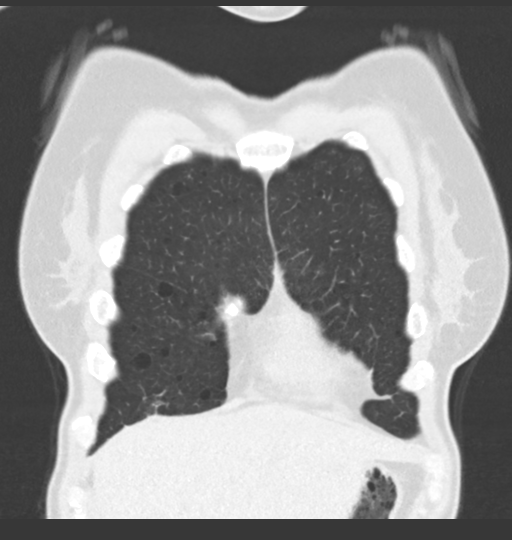
[im 46/114  lung]
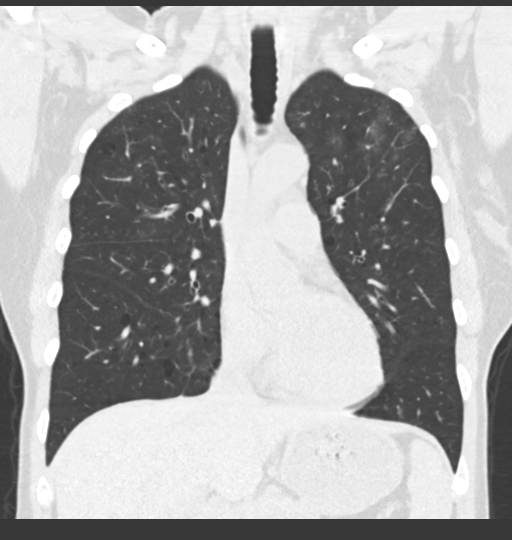
[im 68/114  lung]
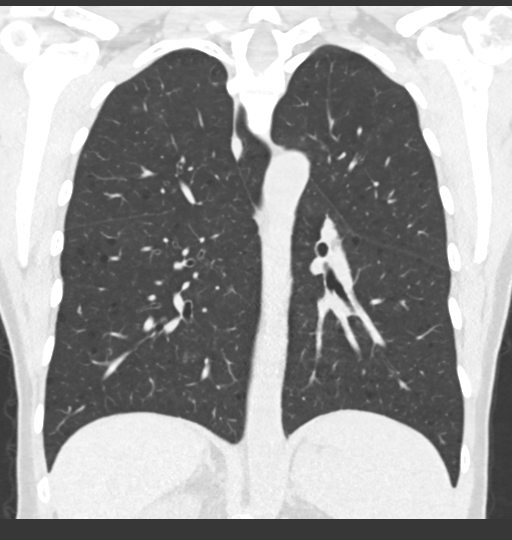

[15 of 36 positions shown; findings below may reference images not displayed]

FINDINGS: Cardiovascular: Normal heart size. Trace pericardial
effusion/thickening. Great vessels are normal in course and caliber.

Mediastinum/Nodes: No discrete thyroid nodules. Unremarkable
esophagus. No pathologically enlarged axillary, mediastinal or hilar
lymph nodes, noting limited sensitivity for the detection of hilar
adenopathy on this noncontrast study.

Lungs/Pleura: No pneumothorax. No pleural effusion. Two scattered
solid subpleural pulmonary nodules, largest with average diameter 5
mm in the right middle lobe associated with the minor fissure
(series 5/image 83). No acute consolidative airspace disease or lung
masses. Numerous small round thin-walled air cysts are scattered in
both lungs, most prominent in the lower lobes, largest 1.0 cm in the
right lower lobe (series 7/image 124). Mild patchy ground-glass
opacities in both lungs, upper lung predominant. No significant
regions of traction bronchiectasis, parenchymal banding,
architectural distortion or frank honeycombing. No significant air
trapping on the expiration sequence.

Upper abdomen: No acute abnormality.

Musculoskeletal: No aggressive appearing focal osseous lesions.
Minimal thoracic spondylosis.
IMPRESSION: 1. Numerous small round thin-walled air cysts throughout both lungs.
Mild patchy ground-glass opacity, upper lung predominant. Few
scattered small subpleural solid nodules. Differential
considerations include lymphoid interstitial pneumonia (LIP) or
lymphangioleiomyomatosis. Follow-up high-resolution chest CT
suggested in 12 months to assess temporal pattern stability.
2. Trace pericardial effusion/thickening.

## 2020-10-24 ENCOUNTER — Other Ambulatory Visit: Payer: Self-pay | Admitting: Physician Assistant

## 2020-10-24 DIAGNOSIS — Z1231 Encounter for screening mammogram for malignant neoplasm of breast: Secondary | ICD-10-CM

## 2020-11-08 ENCOUNTER — Other Ambulatory Visit: Payer: Self-pay

## 2020-11-08 ENCOUNTER — Ambulatory Visit (INDEPENDENT_AMBULATORY_CARE_PROVIDER_SITE_OTHER): Payer: BC Managed Care – PPO

## 2020-11-08 DIAGNOSIS — Z1231 Encounter for screening mammogram for malignant neoplasm of breast: Secondary | ICD-10-CM

## 2020-11-09 ENCOUNTER — Ambulatory Visit: Payer: BC Managed Care – PPO

## 2022-01-11 ENCOUNTER — Other Ambulatory Visit: Payer: Self-pay | Admitting: Physician Assistant

## 2022-01-11 DIAGNOSIS — Z1231 Encounter for screening mammogram for malignant neoplasm of breast: Secondary | ICD-10-CM

## 2022-01-16 ENCOUNTER — Ambulatory Visit (INDEPENDENT_AMBULATORY_CARE_PROVIDER_SITE_OTHER): Payer: BC Managed Care – PPO

## 2022-01-16 DIAGNOSIS — Z1231 Encounter for screening mammogram for malignant neoplasm of breast: Secondary | ICD-10-CM | POA: Diagnosis not present
